# Patient Record
Sex: Male | Born: 1940 | Race: White | Hispanic: No | Marital: Married | Smoking: Never smoker
Health system: Southern US, Community
[De-identification: ages and names within clinical notes are randomized; demographics above are authoritative.]

## PROBLEM LIST (undated history)

## (undated) DIAGNOSIS — I442 Atrioventricular block, complete: Secondary | ICD-10-CM

## (undated) DIAGNOSIS — I10 Essential (primary) hypertension: Secondary | ICD-10-CM

## (undated) HISTORY — DX: Atrioventricular block, complete: I44.2

---

## 2000-03-19 ENCOUNTER — Other Ambulatory Visit: Admission: RE | Admit: 2000-03-19 | Discharge: 2000-03-19 | Payer: Self-pay | Admitting: Urology

## 2019-04-28 ENCOUNTER — Emergency Department (HOSPITAL_BASED_OUTPATIENT_CLINIC_OR_DEPARTMENT_OTHER)
Admission: EM | Admit: 2019-04-28 | Discharge: 2019-04-28 | Disposition: A | Discharge status: Home / Self Care | Payer: Medicare Other | Attending: Emergency Medicine | Admitting: Emergency Medicine

## 2019-04-28 ENCOUNTER — Emergency Department (HOSPITAL_BASED_OUTPATIENT_CLINIC_OR_DEPARTMENT_OTHER): Payer: Medicare Other

## 2019-04-28 ENCOUNTER — Other Ambulatory Visit: Payer: Self-pay

## 2019-04-28 DIAGNOSIS — Y9389 Activity, other specified: Secondary | ICD-10-CM | POA: Insufficient documentation

## 2019-04-28 DIAGNOSIS — S0003XA Contusion of scalp, initial encounter: Secondary | ICD-10-CM | POA: Diagnosis not present

## 2019-04-28 DIAGNOSIS — I129 Hypertensive chronic kidney disease with stage 1 through stage 4 chronic kidney disease, or unspecified chronic kidney disease: Secondary | ICD-10-CM | POA: Diagnosis not present

## 2019-04-28 DIAGNOSIS — W0110XA Fall on same level from slipping, tripping and stumbling with subsequent striking against unspecified object, initial encounter: Secondary | ICD-10-CM | POA: Insufficient documentation

## 2019-04-28 DIAGNOSIS — Y92003 Bedroom of unspecified non-institutional (private) residence as the place of occurrence of the external cause: Secondary | ICD-10-CM | POA: Insufficient documentation

## 2019-04-28 DIAGNOSIS — N183 Chronic kidney disease, stage 3 (moderate): Secondary | ICD-10-CM | POA: Diagnosis not present

## 2019-04-28 DIAGNOSIS — S0990XA Unspecified injury of head, initial encounter: Secondary | ICD-10-CM | POA: Diagnosis present

## 2019-04-28 DIAGNOSIS — Y999 Unspecified external cause status: Secondary | ICD-10-CM | POA: Insufficient documentation

## 2019-04-28 DIAGNOSIS — R55 Syncope and collapse: Secondary | ICD-10-CM | POA: Insufficient documentation

## 2019-04-28 DIAGNOSIS — Z79899 Other long term (current) drug therapy: Secondary | ICD-10-CM | POA: Diagnosis not present

## 2019-04-28 LAB — CBC WITH DIFFERENTIAL/PLATELET
Abs Immature Granulocytes: 0.03 10*3/uL (ref 0.00–0.07)
Basophils Absolute: 0.1 10*3/uL (ref 0.0–0.1)
Basophils Relative: 1 %
Eosinophils Absolute: 0 10*3/uL (ref 0.0–0.5)
Eosinophils Relative: 0 %
HCT: 48.1 % (ref 39.0–52.0)
Hemoglobin: 15.5 g/dL (ref 13.0–17.0)
Immature Granulocytes: 0 %
Lymphocytes Relative: 20 %
Lymphs Abs: 1.5 10*3/uL (ref 0.7–4.0)
MCH: 28.8 pg (ref 26.0–34.0)
MCHC: 32.2 g/dL (ref 30.0–36.0)
MCV: 89.2 fL (ref 80.0–100.0)
Monocytes Absolute: 0.7 10*3/uL (ref 0.1–1.0)
Monocytes Relative: 9 %
Neutro Abs: 5.4 10*3/uL (ref 1.7–7.7)
Neutrophils Relative %: 70 %
Platelets: 160 10*3/uL (ref 150–400)
RBC: 5.39 MIL/uL (ref 4.22–5.81)
RDW: 13.8 % (ref 11.5–15.5)
WBC: 7.7 10*3/uL (ref 4.0–10.5)
nRBC: 0 % (ref 0.0–0.2)

## 2019-04-28 LAB — COMPREHENSIVE METABOLIC PANEL
ALT: 16 U/L (ref 0–44)
AST: 19 U/L (ref 15–41)
Albumin: 3.9 g/dL (ref 3.5–5.0)
Alkaline Phosphatase: 70 U/L (ref 38–126)
Anion gap: 11 (ref 5–15)
BUN: 27 mg/dL — ABNORMAL HIGH (ref 8–23)
CO2: 23 mmol/L (ref 22–32)
Calcium: 8.9 mg/dL (ref 8.9–10.3)
Chloride: 105 mmol/L (ref 98–111)
Creatinine, Ser: 1.64 mg/dL — ABNORMAL HIGH (ref 0.61–1.24)
GFR calc Af Amer: 46 mL/min — ABNORMAL LOW (ref >60)
GFR calc non Af Amer: 40 mL/min — ABNORMAL LOW (ref >60)
Glucose, Bld: 135 mg/dL — ABNORMAL HIGH (ref 70–99)
Potassium: 3.8 mmol/L (ref 3.5–5.1)
Sodium: 139 mmol/L (ref 135–145)
Total Bilirubin: 1.1 mg/dL (ref 0.3–1.2)
Total Protein: 7.1 g/dL (ref 6.5–8.1)

## 2019-04-28 LAB — URINALYSIS, ROUTINE W REFLEX MICROSCOPIC
Bilirubin Urine: NEGATIVE
Glucose, UA: NEGATIVE mg/dL
Hgb urine dipstick: NEGATIVE
Ketones, ur: NEGATIVE mg/dL
Leukocytes,Ua: NEGATIVE
Nitrite: NEGATIVE
Protein, ur: NEGATIVE mg/dL
Specific Gravity, Urine: 1.015 (ref 1.005–1.030)
pH: 6 (ref 5.0–8.0)

## 2019-04-28 LAB — TROPONIN I (HIGH SENSITIVITY)
Troponin I (High Sensitivity): 10 ng/L (ref <18)
Troponin I (High Sensitivity): 11 ng/L (ref <18)

## 2019-04-28 LAB — CBG MONITORING, ED: Glucose-Capillary: 136 mg/dL — ABNORMAL HIGH (ref 70–99)

## 2019-04-28 MED ORDER — SODIUM CHLORIDE 0.9 % IV SOLN: INTRAVENOUS | Status: DC

## 2019-04-28 MED ORDER — SODIUM CHLORIDE 0.9 % IV BOLUS
1000.0000 mL | Freq: Once | INTRAVENOUS | Status: AC
  Administered 2019-04-28: 1000 mL via INTRAVENOUS

## 2019-04-28 NOTE — ED Provider Notes (Signed)
[facility] EMERGENCY DEPARTMENT Provider Note   CSN: [id number] Arrival date & time: 04/28/19  [id number]    History   Chief Complaint Chief Complaint  Patient presents with   Dizziness    HPI Larry Macdonald is a 78 y.o. male.     Pt presents to the ED today with a syncopal episode which happened around 0300 today.  Pt said he woke up early this morning and felt a little nauseous and dizzy.  He slowly got up to get a glass of water.  He got back into bed without problems, then tried to get up again.  He said things went "white" and he was on the ground.  He did hit his forehead.  He denies cp.  No f/c.  No known covid exposures.  His doctor did stop his amlodipine on his 03/22/19 visit.  He feels well now.     No past medical history on file.  There are no active problems to display for this patient.  Pmhx:  Htn, erectile dysfunction, ckd (stage 3)  Family History  Problem Relation Age of Onset   Lung cancer Father   Nephrolithiasis Father   Colon cancer Paternal Uncle   Nephrolithiasis Brother   Diabetes Neg Hx   Coronary artery disease Neg Hx   Stroke Neg Hx   Past Surgical History:  Procedure Laterality Date   CATARACT EXTRACTION, BILATERAL   VASECTOMY         Home Medications    Prior to Admission medications   Medication Sig Start Date End Date Taking? Authorizing Provider  lisinopril-hydrochlorothiazide (ZESTORETIC) 20-12.5 MG tablet Take 1 tablet by mouth daily.   Yes [provider]    Family History No family history on file.  Social History Social History   Tobacco Use   Smoking status: Not on file  Substance Use Topics   Alcohol use: Not on file   Drug use: Not on file     Allergies   Patient has no allergy information on record.   Review of Systems Review of Systems  Neurological: Positive for syncope and headaches.  All other systems reviewed and are negative.    Physical Exam Updated Vital  Signs BP (!) 162/84 (BP Location: Right Arm)    Pulse (!) 59    Temp 97.9 F (36.6 C) (Oral)    Resp 20    Ht 5\' 11"  (1.803 m)    Wt 83.9 kg    SpO2 100%    BMI 25.80 kg/m   Physical Exam Vitals signs and nursing note reviewed.  Constitutional:      Appearance: Normal appearance.  HENT:     Head: Normocephalic.      Right Ear: External ear normal.     Left Ear: External ear normal.     Nose: Nose normal.     Mouth/Throat:     Mouth: Mucous membranes are moist.     Pharynx: Oropharynx is clear.  Eyes:     Extraocular Movements: Extraocular movements intact.     Conjunctiva/sclera: Conjunctivae normal.     Pupils: Pupils are equal, round, and reactive to light.  Neck:     Musculoskeletal: Normal range of motion and neck supple.  Cardiovascular:     Rate and Rhythm: Normal rate and regular rhythm.     Pulses: Normal pulses.     Heart sounds: Normal heart sounds.  Pulmonary:     Effort: Pulmonary effort is normal.     Breath  sounds: Normal breath sounds.  Abdominal:     General: Abdomen is flat. Bowel sounds are normal.     Palpations: Abdomen is soft.  Musculoskeletal: Normal range of motion.  Skin:    General: Skin is warm.     Capillary Refill: Capillary refill takes less than 2 seconds.  Neurological:     General: No focal deficit present.     Mental Status: He is alert and oriented to person, place, and time.  Psychiatric:        Mood and Affect: Mood normal.        Behavior: Behavior normal.        Thought Content: Thought content normal.        Judgment: Judgment normal.      ED Treatments / Results  Labs (all labs ordered are listed, but only abnormal results are displayed) Labs Reviewed  COMPREHENSIVE METABOLIC PANEL - Abnormal; Notable for the following components:      Result Value   Glucose, Bld 135 (*)    BUN 27 (*)    Creatinine, Ser 1.64 (*)    GFR calc non Af Amer 40 (*)    GFR calc Af Amer 46 (*)    All other components within normal limits    CBG MONITORING, ED - Abnormal; Notable for the following components:   Glucose-Capillary 136 (*)    All other components within normal limits  CBC WITH DIFFERENTIAL/PLATELET  URINALYSIS, ROUTINE W REFLEX MICROSCOPIC  TROPONIN I (HIGH SENSITIVITY)  TROPONIN I (HIGH SENSITIVITY)    EKG EKG Interpretation  Date/Time:  Thursday April 28 2019 09:17:39 EDT Ventricular Rate:  65 PR Interval:    QRS Duration: 178 QT Interval:  459 QTC Calculation: 478 R Axis:   -73 Text Interpretation:  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 EXAM: CHEST - 2 VIEW COMPARISON:  None. FINDINGS: Normal sized heart. Tortuous aorta. Clear lungs. Thoracic spine degenerative changes. IMPRESSION: No acute abnormality. Electronically Signed   By: Claudie Revering M.D.   On: 04/28/2019 09:50   Ct Head Wo Contrast  Result Date: 04/28/2019 CLINICAL DATA:  Syncopal episode resulting in a fall, hitting his head last night. EXAM: CT HEAD WITHOUT CONTRAST TECHNIQUE: Contiguous axial images were obtained from the base of the skull through the vertex without intravenous contrast. COMPARISON:  None. FINDINGS: Brain: Mildly enlarged ventricles and cortical sulci. Mild patchy white matter low density in both cerebral hemispheres. No intracranial hemorrhage, mass lesion or CT evidence of acute infarction. Vascular: No hyperdense vessel or unexpected calcification. Skull: Normal. Negative for fracture or focal lesion. Sinuses/Orbits: Status post bilateral cataract extraction. Small amount of retained secretions in the sphenoid sinus on the right. Other: Bilateral concha bullosa, greater on the left, with mild deviation of the adjacent mid nasal septum to the right. IMPRESSION: 1. No acute abnormality. 2. Mild diffuse  cerebral and cerebellar atrophy. 3. Mild chronic small vessel white matter ischemic changes in both cerebral hemispheres. Electronically Signed   By: Claudie Revering M.D.   On: 04/28/2019 09:54    Procedures Procedures (including critical care time)  Medications Ordered in ED Medications  sodium chloride 0.9 % bolus 1,000 mL (0 mLs Intravenous Stopped 04/28/19 1133)    And  0.9 %  sodium chloride infusion (has  no administration in time range)     Initial Impression / Assessment and Plan / ED Course  I have reviewed the triage vital signs and the nursing notes.  Pertinent labs & imaging results that were available during my care of the patient were reviewed by me and considered in my medical decision making (see chart for details).    Pt is feeling well now.  I think the syncope is related to his bp.  Pt has been keeping track of his bp and they have been low since he was taken off his amlodipine.  He said he's been checking them at random times during the day.  I asked him to check then in the morning and at night.  Then let his doctor know.  Here, the SBPs are in the 150s to 160s.  Pt had 2 negative troponins and other work up is nl.  Pt is stable for d/c.  Return if worse.   Final Clinical Impressions(s) / ED Diagnoses   Final diagnoses:  Syncope, unspecified syncope type  Contusion of scalp, initial encounter    ED Discharge Orders    None       Jacalyn LefevreHaviland, Maliki Gignac, MD 04/28/19 1330

## 2019-04-28 NOTE — ED Triage Notes (Signed)
Pt states recently changed dose of amlodipine, dizzy last night after getting up from bed, hit head, small bruise left forehead.  Pt states feels fine today.  PMD sent to ER

## 2019-11-04 ENCOUNTER — Ambulatory Visit: Payer: Medicare Other | Attending: Internal Medicine

## 2019-11-04 DIAGNOSIS — Z23 Encounter for immunization: Secondary | ICD-10-CM | POA: Insufficient documentation

## 2019-11-04 NOTE — Progress Notes (Signed)
   Covid-19 Vaccination Clinic  Name:  Larry Macdonald    MRN: [id number] DOB: 01-20-1941  11/04/2019  [name] was observed post Covid-19 immunization for 15 minutes without incidence. He was provided with Vaccine Information Sheet and instruction to access the V-Safe system.   [name] was instructed to call 911 with any severe reactions post vaccine: Marland Kitchen Difficulty breathing  . Swelling of your face and throat  . A fast heartbeat  . A bad rash all over your body  . Dizziness and weakness    Immunizations Administered    Name Date Dose VIS Date Route   Pfizer COVID-19 Vaccine 11/04/2019  8:52 AM 0.3 mL 09/30/2019 Intramuscular   Manufacturer: [employer], [employer]   Lot: [id number]   NDC: [address]-8

## 2019-11-25 ENCOUNTER — Ambulatory Visit: Payer: Medicare Other | Attending: Internal Medicine

## 2019-11-25 DIAGNOSIS — Z23 Encounter for immunization: Secondary | ICD-10-CM | POA: Insufficient documentation

## 2019-11-25 NOTE — Progress Notes (Signed)
   Covid-19 Vaccination Clinic  Name:  Larry Macdonald    MRN: [id number] DOB: 07-May-1941  11/25/2019  Larry Macdonald was observed post Covid-19 immunization for 15 minutes without incidence. He was provided with Vaccine Information Sheet and instruction to access the V-Safe system.   Larry Macdonald was instructed to call 911 with any severe reactions post vaccine: Marland Kitchen Difficulty breathing  . Swelling of your face and throat  . A fast heartbeat  . A bad rash all over your body  . Dizziness and weakness    Immunizations Administered    Name Date Dose VIS Date Route   Pfizer COVID-19 Vaccine 11/25/2019  9:01 AM 0.3 mL 09/30/2019 Intramuscular   Manufacturer: [employer], [employer]   Lot: [id number]   NDC: [address]-9

## 2019-11-29 IMAGING — CT CT HEAD WITHOUT CONTRAST
3 series · 15 of 47 positions shown, 18 images · non-contrast
Comparison: None.

CLINICAL DATA: Syncopal episode resulting in a fall, hitting his
head last night.

EXAM:
CT HEAD WITHOUT CONTRAST
TECHNIQUE: Contiguous axial images were obtained from the base of the skull
through the vertex without intravenous contrast.

[Series 2: head wo · axial · 0.45mm/px · z∈[-179,-39]mm · 9 of 34 slices shown, 12 images]
[im 3/34  brain]
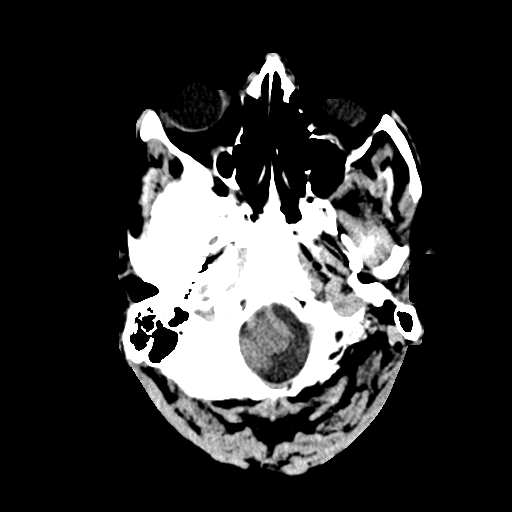
[im 3/34  bone]
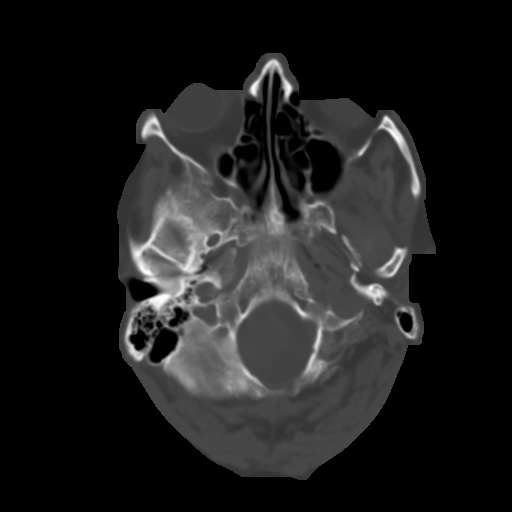
[im 6/34  brain]
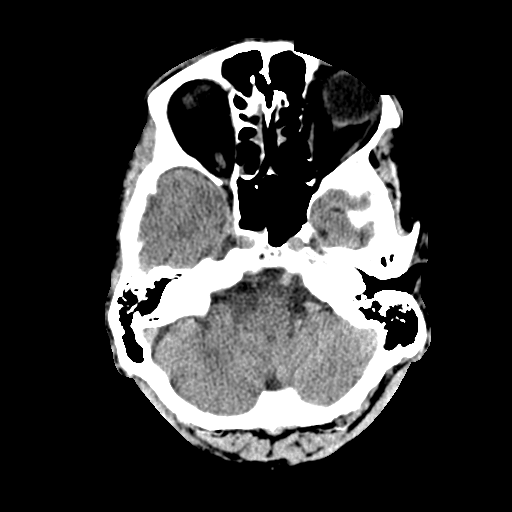
[im 10/34  brain]
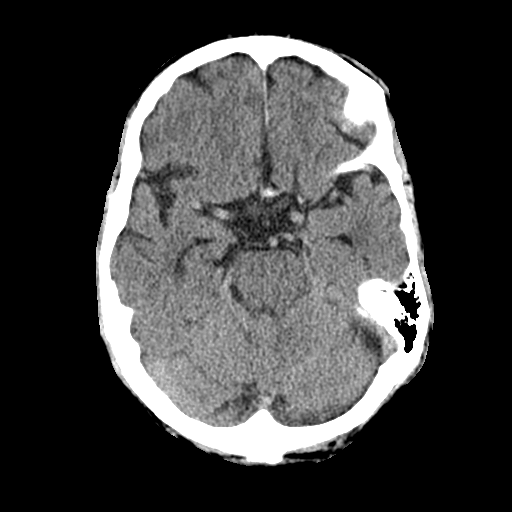
[im 13/34  brain]
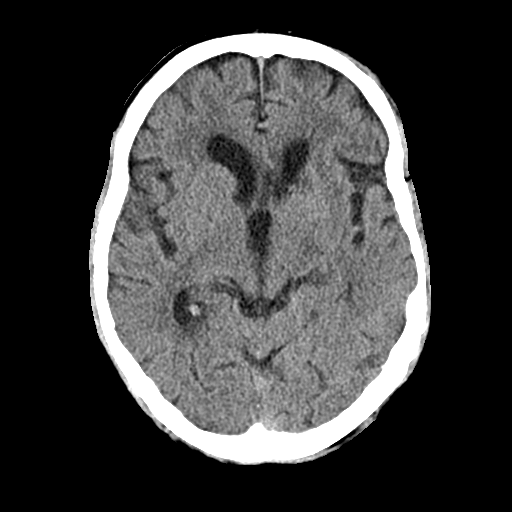
[im 18/34  brain]
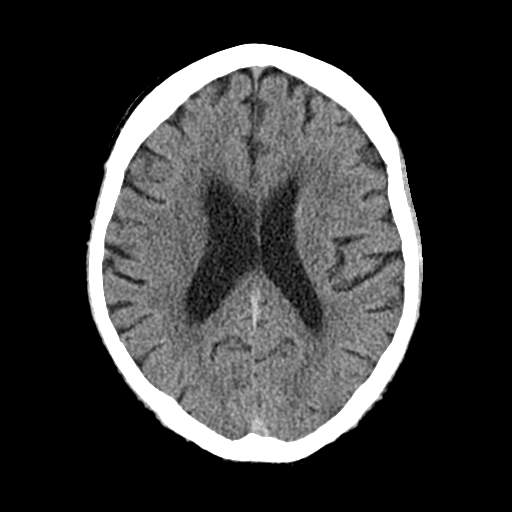
[im 18/34  bone]
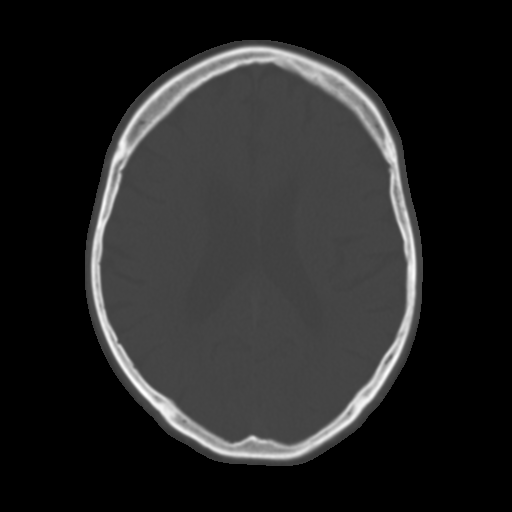
[im 21/34  brain]
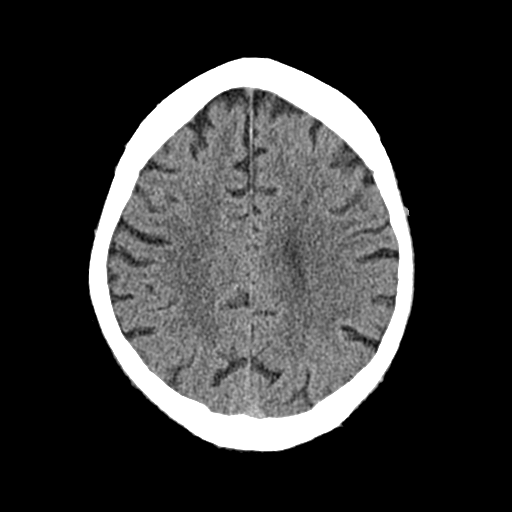
[im 24/34  brain]
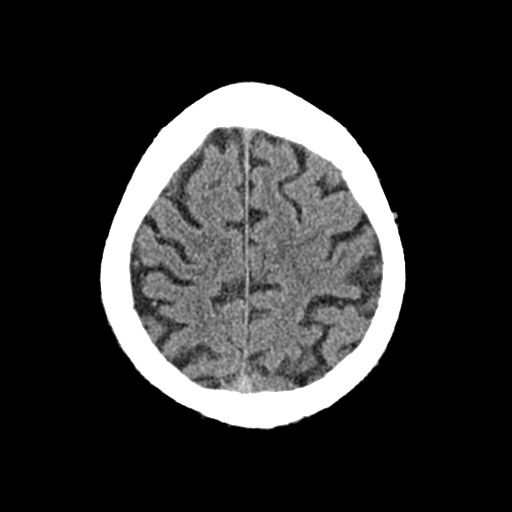
[im 28/34  brain]
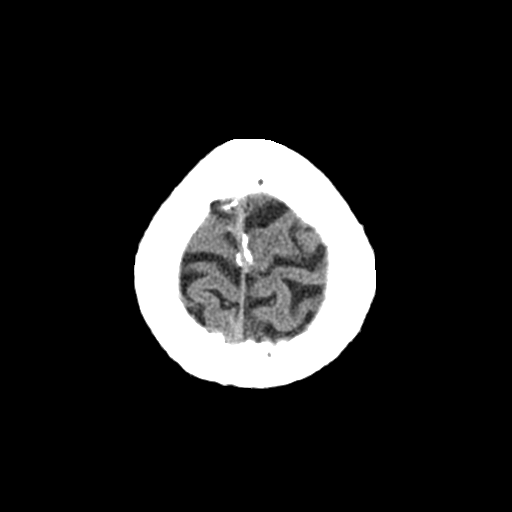
[im 31/34  brain]
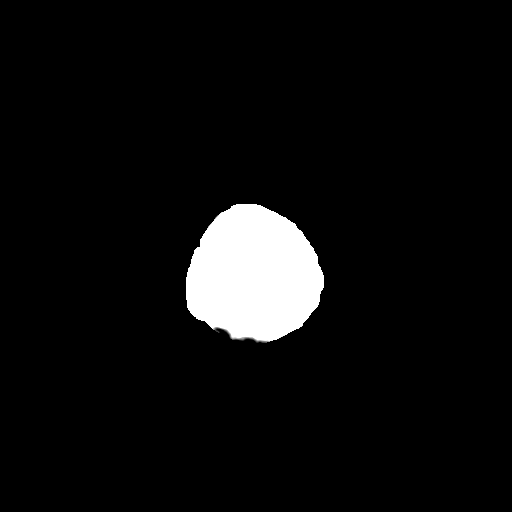
[im 31/34  bone]
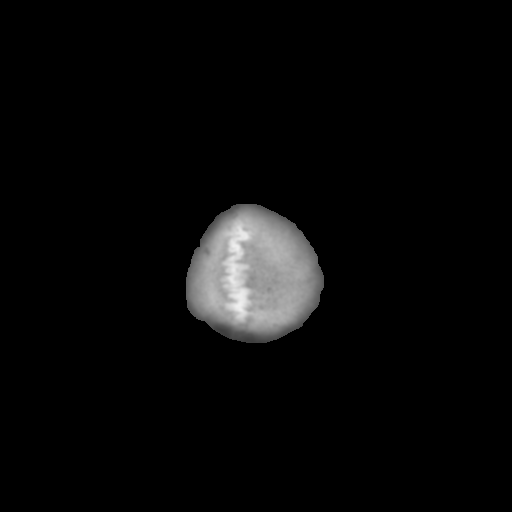

[Series 4: cor soft · coronal · 0.35mm/px · 3 of 71 slices shown]
[im 24/71  brain]
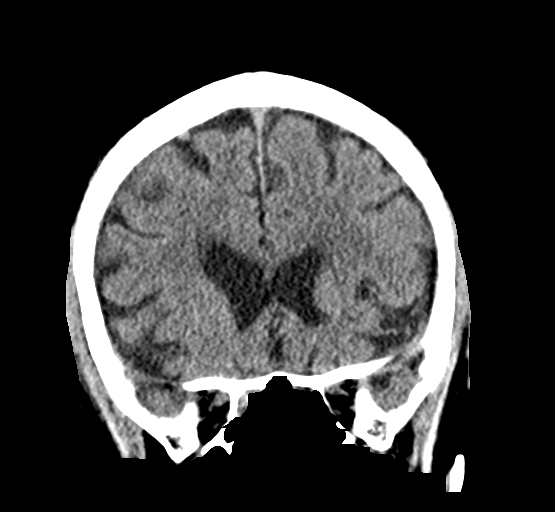
[im 32/71  brain]
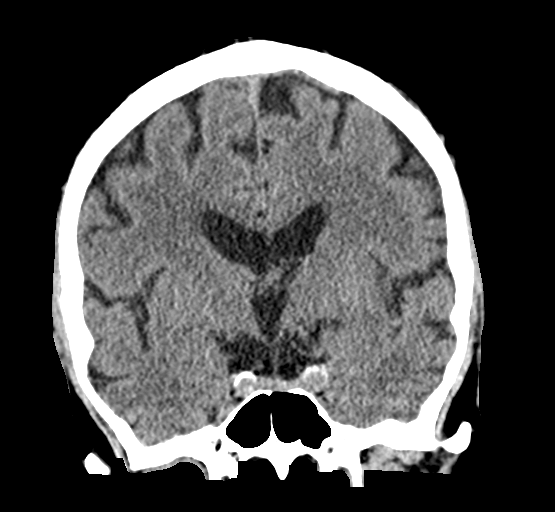
[im 39/71  brain]
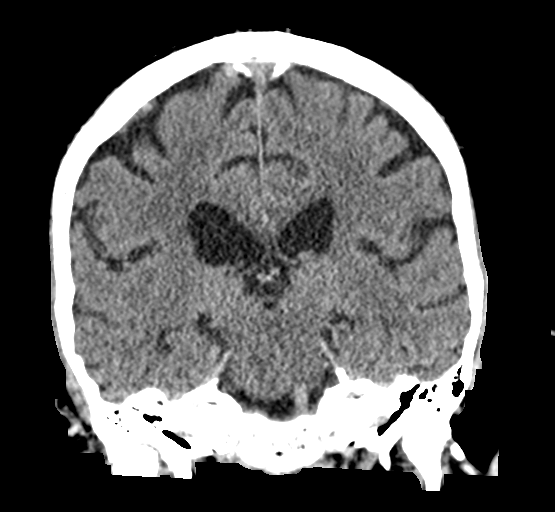

[Series 5: sag soft · sagittal · 0.36mm/px · 3 of 59 slices shown]
[im 20/59  brain]
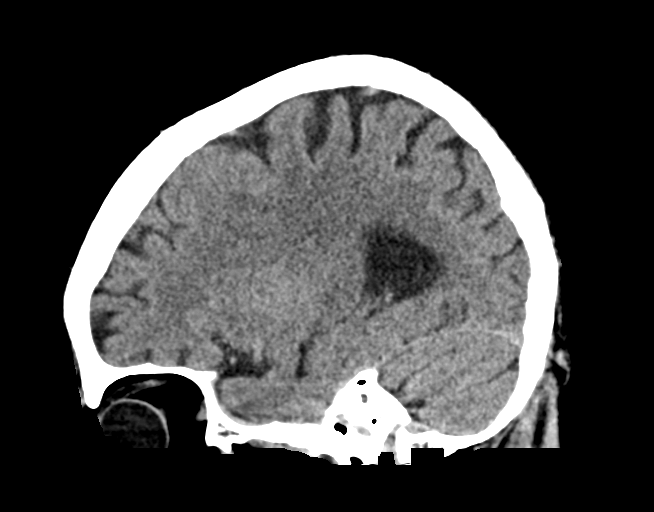
[im 30/59  brain]
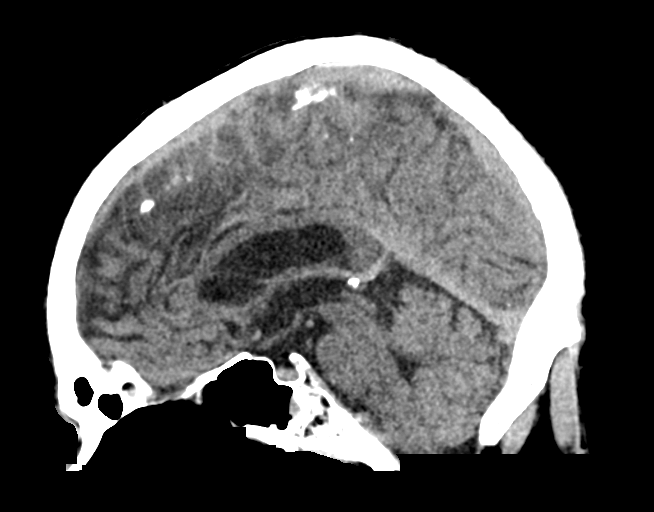
[im 39/59  brain]
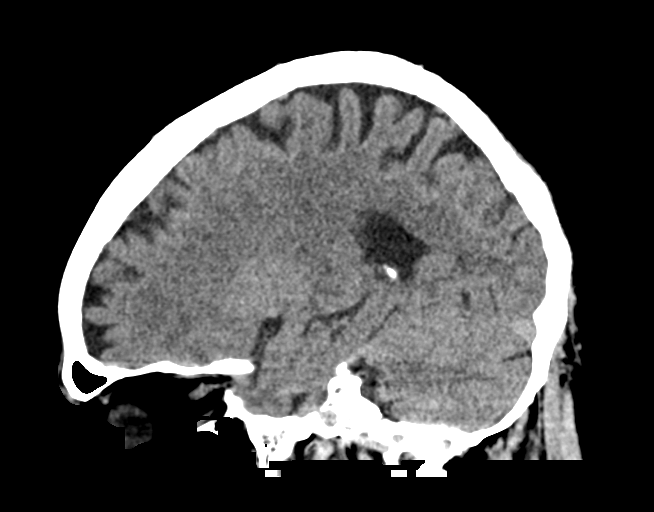

[15 of 47 positions shown; findings below may reference images not displayed]

FINDINGS: Brain: Mildly enlarged ventricles and cortical sulci. Mild patchy
white matter low density in both cerebral hemispheres. No
intracranial hemorrhage, mass lesion or CT evidence of acute
infarction.

Vascular: No hyperdense vessel or unexpected calcification.

Skull: Normal. Negative for fracture or focal lesion.

Sinuses/Orbits: Status post bilateral cataract extraction. Small
amount of retained secretions in the sphenoid sinus on the right.

Other: Bilateral concha bullosa, greater on the left, with mild
deviation of the adjacent mid nasal septum to the right.
IMPRESSION: 1. No acute abnormality.
2. Mild diffuse cerebral and cerebellar atrophy.
3. Mild chronic small vessel white matter ischemic changes in both
cerebral hemispheres.

## 2021-04-01 ENCOUNTER — Emergency Department (HOSPITAL_BASED_OUTPATIENT_CLINIC_OR_DEPARTMENT_OTHER): Payer: Medicare Other

## 2021-04-01 ENCOUNTER — Encounter (HOSPITAL_BASED_OUTPATIENT_CLINIC_OR_DEPARTMENT_OTHER): Payer: Self-pay | Admitting: Urology

## 2021-04-01 ENCOUNTER — Inpatient Hospital Stay (HOSPITAL_BASED_OUTPATIENT_CLINIC_OR_DEPARTMENT_OTHER)
Admission: EM | Admit: 2021-04-01 | Discharge: 2021-04-03 | DRG: 244 | Disposition: A | Discharge status: Home / Self Care | Payer: Medicare Other | Attending: Internal Medicine | Admitting: Internal Medicine

## 2021-04-01 ENCOUNTER — Other Ambulatory Visit: Payer: Self-pay

## 2021-04-01 DIAGNOSIS — I10 Essential (primary) hypertension: Secondary | ICD-10-CM

## 2021-04-01 DIAGNOSIS — R42 Dizziness and giddiness: Secondary | ICD-10-CM | POA: Diagnosis not present

## 2021-04-01 DIAGNOSIS — F17210 Nicotine dependence, cigarettes, uncomplicated: Secondary | ICD-10-CM | POA: Diagnosis present

## 2021-04-01 DIAGNOSIS — R001 Bradycardia, unspecified: Secondary | ICD-10-CM

## 2021-04-01 DIAGNOSIS — M109 Gout, unspecified: Secondary | ICD-10-CM | POA: Diagnosis present

## 2021-04-01 DIAGNOSIS — I129 Hypertensive chronic kidney disease with stage 1 through stage 4 chronic kidney disease, or unspecified chronic kidney disease: Secondary | ICD-10-CM | POA: Diagnosis present

## 2021-04-01 DIAGNOSIS — Z20822 Contact with and (suspected) exposure to covid-19: Secondary | ICD-10-CM | POA: Diagnosis present

## 2021-04-01 DIAGNOSIS — Z8616 Personal history of COVID-19: Secondary | ICD-10-CM

## 2021-04-01 DIAGNOSIS — I442 Atrioventricular block, complete: Principal | ICD-10-CM | POA: Diagnosis present

## 2021-04-01 DIAGNOSIS — Z959 Presence of cardiac and vascular implant and graft, unspecified: Secondary | ICD-10-CM

## 2021-04-01 DIAGNOSIS — N183 Chronic kidney disease, stage 3 unspecified: Secondary | ICD-10-CM | POA: Diagnosis present

## 2021-04-01 DIAGNOSIS — Z79899 Other long term (current) drug therapy: Secondary | ICD-10-CM

## 2021-04-01 HISTORY — DX: Essential (primary) hypertension: I10

## 2021-04-01 LAB — COMPREHENSIVE METABOLIC PANEL
ALT: 33 U/L (ref 0–44)
AST: 30 U/L (ref 15–41)
Albumin: 3.8 g/dL (ref 3.5–5.0)
Alkaline Phosphatase: 75 U/L (ref 38–126)
Anion gap: 8 (ref 5–15)
BUN: 20 mg/dL (ref 8–23)
CO2: 22 mmol/L (ref 22–32)
Calcium: 9 mg/dL (ref 8.9–10.3)
Chloride: 109 mmol/L (ref 98–111)
Creatinine, Ser: 1.57 mg/dL — ABNORMAL HIGH (ref 0.61–1.24)
GFR, Estimated: 45 mL/min — ABNORMAL LOW (ref >60)
Glucose, Bld: 157 mg/dL — ABNORMAL HIGH (ref 70–99)
Potassium: 3.9 mmol/L (ref 3.5–5.1)
Sodium: 139 mmol/L (ref 135–145)
Total Bilirubin: 1.2 mg/dL (ref 0.3–1.2)
Total Protein: 7.1 g/dL (ref 6.5–8.1)

## 2021-04-01 LAB — CBC WITH DIFFERENTIAL/PLATELET
Abs Immature Granulocytes: 0.03 10*3/uL (ref 0.00–0.07)
Basophils Absolute: 0.1 10*3/uL (ref 0.0–0.1)
Basophils Relative: 1 %
Eosinophils Absolute: 0.3 10*3/uL (ref 0.0–0.5)
Eosinophils Relative: 4 %
HCT: 48.8 % (ref 39.0–52.0)
Hemoglobin: 16 g/dL (ref 13.0–17.0)
Immature Granulocytes: 0 %
Lymphocytes Relative: 17 %
Lymphs Abs: 1.5 10*3/uL (ref 0.7–4.0)
MCH: 30 pg (ref 26.0–34.0)
MCHC: 32.8 g/dL (ref 30.0–36.0)
MCV: 91.6 fL (ref 80.0–100.0)
Monocytes Absolute: 0.6 10*3/uL (ref 0.1–1.0)
Monocytes Relative: 7 %
Neutro Abs: 6.7 10*3/uL (ref 1.7–7.7)
Neutrophils Relative %: 71 %
Platelets: 159 10*3/uL (ref 150–400)
RBC: 5.33 MIL/uL (ref 4.22–5.81)
RDW: 14.6 % (ref 11.5–15.5)
WBC: 9.3 10*3/uL (ref 4.0–10.5)
nRBC: 0 % (ref 0.0–0.2)

## 2021-04-01 LAB — CREATININE, SERUM
Creatinine, Ser: 1.65 mg/dL — ABNORMAL HIGH (ref 0.61–1.24)
GFR, Estimated: 42 mL/min — ABNORMAL LOW (ref >60)

## 2021-04-01 LAB — CBC
HCT: 55.3 % — ABNORMAL HIGH (ref 39.0–52.0)
Hemoglobin: 17.5 g/dL — ABNORMAL HIGH (ref 13.0–17.0)
MCH: 29.9 pg (ref 26.0–34.0)
MCHC: 31.6 g/dL (ref 30.0–36.0)
MCV: 94.4 fL (ref 80.0–100.0)
Platelets: 212 10*3/uL (ref 150–400)
RBC: 5.86 MIL/uL — ABNORMAL HIGH (ref 4.22–5.81)
RDW: 14.6 % (ref 11.5–15.5)
WBC: 14.5 10*3/uL — ABNORMAL HIGH (ref 4.0–10.5)
nRBC: 0 % (ref 0.0–0.2)

## 2021-04-01 LAB — RESP PANEL BY RT-PCR (FLU A&B, COVID) ARPGX2
Influenza A by PCR: NEGATIVE
Influenza B by PCR: NEGATIVE
SARS Coronavirus 2 by RT PCR: NEGATIVE

## 2021-04-01 LAB — MAGNESIUM: Magnesium: 1.6 mg/dL — ABNORMAL LOW (ref 1.7–2.4)

## 2021-04-01 LAB — MRSA NEXT GEN BY PCR, NASAL: MRSA by PCR Next Gen: NOT DETECTED

## 2021-04-01 LAB — TSH: TSH: 3.983 u[IU]/mL (ref 0.350–4.500)

## 2021-04-01 MED ORDER — HYDRALAZINE HCL 10 MG PO TABS
10.0000 mg | ORAL_TABLET | Freq: Four times a day (QID) | ORAL | Status: DC
  Administered 2021-04-01 – 2021-04-03 (×7): 10 mg via ORAL
  Filled 2021-04-01 (×8): qty 1

## 2021-04-01 MED ORDER — MAGNESIUM SULFATE 2 GM/50ML IV SOLN
2.0000 g | Freq: Once | INTRAVENOUS | Status: AC
  Administered 2021-04-01: 2 g via INTRAVENOUS
  Filled 2021-04-01: qty 50

## 2021-04-01 MED ORDER — NITROGLYCERIN 0.4 MG SL SUBL: 0.4000 mg | SUBLINGUAL_TABLET | SUBLINGUAL | Status: DC | PRN

## 2021-04-01 MED ORDER — ALLOPURINOL 100 MG PO TABS
100.0000 mg | ORAL_TABLET | Freq: Every day | ORAL | Status: DC
  Administered 2021-04-02 – 2021-04-03 (×2): 100 mg via ORAL
  Filled 2021-04-01 (×2): qty 1

## 2021-04-01 MED ORDER — HEPARIN SODIUM (PORCINE) 5000 UNIT/ML IJ SOLN
5000.0000 [IU] | Freq: Three times a day (TID) | INTRAMUSCULAR | Status: DC
  Administered 2021-04-01 – 2021-04-02 (×2): 5000 [IU] via SUBCUTANEOUS
  Filled 2021-04-01 (×2): qty 1

## 2021-04-01 MED ORDER — ASPIRIN EC 81 MG PO TBEC
81.0000 mg | DELAYED_RELEASE_TABLET | Freq: Every day | ORAL | Status: DC
  Administered 2021-04-02: 81 mg via ORAL
  Filled 2021-04-01: qty 1

## 2021-04-01 MED ORDER — ONDANSETRON HCL 4 MG/2ML IJ SOLN: 4.0000 mg | Freq: Four times a day (QID) | INTRAMUSCULAR | Status: DC | PRN

## 2021-04-01 MED ORDER — MAGNESIUM SULFATE 50 % IJ SOLN: 2.0000 g | Freq: Once | INTRAMUSCULAR | Status: DC

## 2021-04-01 MED ORDER — ACETAMINOPHEN 325 MG PO TABS: 650.0000 mg | ORAL_TABLET | ORAL | Status: DC | PRN

## 2021-04-01 NOTE — Plan of Care (Signed)
Initiate Care Plan  Problem: Education: Goal: Knowledge of General Education information will improve Description: Including pain rating scale, medication(s)/side effects and non-pharmacologic comfort measures Outcome: Progressing   Problem: Health Behavior/Discharge Planning: Goal: Ability to manage health-related needs will improve Outcome: Progressing   Problem: Clinical Measurements: Goal: Ability to maintain clinical measurements within normal limits will improve Outcome: Progressing Goal: Will remain free from infection Outcome: Progressing Goal: Diagnostic test results will improve Outcome: Progressing Goal: Respiratory complications will improve Outcome: Progressing Goal: Cardiovascular complication will be avoided Outcome: Progressing   Problem: Activity: Goal: Risk for activity intolerance will decrease Outcome: Progressing   Problem: Nutrition: Goal: Adequate nutrition will be maintained Outcome: Progressing   Problem: Coping: Goal: Level of anxiety will decrease Outcome: Progressing   Problem: Elimination: Goal: Will not experience complications related to bowel motility Outcome: Progressing Goal: Will not experience complications related to urinary retention Outcome: Progressing   Problem: Pain Managment: Goal: General experience of comfort will improve Outcome: Progressing   Problem: Safety: Goal: Ability to remain free from injury will improve Outcome: Progressing   Problem: Skin Integrity: Goal: Risk for impaired skin integrity will decrease Outcome: Progressing   Problem: Education: Goal: Ability to demonstrate management of disease process will improve Outcome: Progressing Goal: Ability to verbalize understanding of medication therapies will improve Outcome: Progressing Goal: Individualized Educational Video(s) Outcome: Progressing   Problem: Activity: Goal: Capacity to carry out activities will improve Outcome: Progressing   Problem:  Cardiac: Goal: Ability to achieve and maintain adequate cardiopulmonary perfusion will improve Outcome: Progressing   

## 2021-04-01 NOTE — ED Notes (Signed)
To on monitor and vitals cycling

## 2021-04-01 NOTE — ED Notes (Signed)
Report given to [facility] with [employer]

## 2021-04-01 NOTE — ED Notes (Signed)
ED Provider at bedside. 

## 2021-04-01 NOTE — ED Notes (Signed)
Report given to Rosalita Chessman RN receiving nurse at [facility]

## 2021-04-01 NOTE — ED Notes (Signed)
Pt aware of need to remain NPO, states last po food/fluid was breakfast @0800 .  Aware of plan for admission

## 2021-04-01 NOTE — H&P (Signed)
Cardiology Admission History and Physical:   Patient ID: Larry Macdonald MRN: [id number]; DOB: Aug 20, 1941   Admission date: 04/01/2021  PCP:  Burnis Medin, PA-C   [facility] Providers Cardiologist:  None   {  Chief Complaint:  low heart rate with some associated lightheadedness  Patient Profile:   Larry Macdonald is a 80 y.o. male with a history of hypertension, CKD stage III, gout, and remote tobacco use who presents with low heart rate for 1 week with associated lightheadedness and was found to have complete heart block.   History of Present Illness:   Larry Macdonald is a 80 year old male with a history of hypertension, CKD stage III followed by Nephrology, and gout. No known cardiac history. He states he had a stress test over 30 years ago for further work up of hypertension but this was normal. No other prior cardiac testing. He does not see a Development worker, international aid. He has a remote smoking history but quit 45 years ago. No family history of heart disease.  Patient presented to the [facility] ED today for further evaluation of low heart rate. Patient states he checks his BP a couple of times a week at home and has been noticing this past week that his heart rates has been low in the 40s. He also reports that for the last 3-4 day he has noticed some lightheadedness. This is worse with quick position changes but he has had this at rest as well. He denies any recent syncope. He did have 1 syncopal episode in 04/2019 that occurred when he got out of bed quickly. He was seen in the ED at that time. Work-up was unremarkable and syncope was felt to be due to low BP. Echo was not done at that time. No other syncopal episodes. He denies any chest pain. He reports some change in his breathing the last few days but has difficulty describing it. He states it just feels like he is breathing "more" or faster sometimes. No significant shortness of breath though. No orthopnea, PND, or edema. No  recent fevers or illnesses. He has loose stools at baseline but no other GI symptoms. No abnormal bleeding in urine or stools.   In the ED, patient hypertensive with BP as high as 205/97. EKG showed complete heart block, rate 39, with ST depression/ T wave inversion in inferior leads and V4-V6. Chest x-ray showed no acute findings. WBC 9.3, Hgb 16.0, Plts 159. Na 139, K 3.9, Glucose 157, BUN 20, Cr 1.57. LFTs normal. Magnesium 1.6. Respiratory panel negative for COVID and influenza A/B.  Past Medical History:  Diagnosis Date   Hypertension     History reviewed. No pertinent surgical history.   Medications Prior to Admission: Prior to Admission medications   Medication Sig Start Date End Date Taking? Authorizing Provider  allopurinol (ZYLOPRIM) 100 MG tablet Take 100 mg by mouth daily.   Yes [provider]  lisinopril-hydrochlorothiazide (ZESTORETIC) 20-12.5 MG tablet Take 1 tablet by mouth daily.    [provider]     Allergies:   Not on File  Social History:   Social History   Socioeconomic History   Marital status: Married    Spouse name: Not on file   Number of children: Not on file   Years of education: Not on file   Highest education level: Not on file  Occupational History   Not on file  Tobacco Use   Smoking status: Never   Smokeless tobacco:  Not on file  Vaping Use   Vaping Use: Never used  Substance and Sexual Activity   Alcohol use: Yes    Comment: socially   Drug use: Never   Sexual activity: Not on file  Other Topics Concern   Not on file  Social History Narrative   Not on file   Social Determinants of Health   Financial Resource Strain: Not on file  Food Insecurity: Not on file  Transportation Needs: Not on file  Physical Activity: Not on file  Stress: Not on file  Social Connections: Not on file  Intimate Partner Violence: Not on file    Family History:   The patient's family history includes Lung cancer in his father. There  is no history of Heart disease.    ROS:  Please see the history of present illness.  Review of Systems  Constitutional:  Negative for chills and fever.  HENT:  Negative for congestion.   Respiratory:  Negative for cough and shortness of breath.   Cardiovascular:  Negative for chest pain, palpitations, orthopnea, leg swelling and PND.  Gastrointestinal:  Negative for blood in stool, melena, nausea and vomiting. Diarrhea: loose stools. Genitourinary:  Negative for hematuria.  Musculoskeletal:  Negative for myalgias.  Neurological:  Positive for dizziness (lightheadedness). Negative for loss of consciousness.  Endo/Heme/Allergies:  Does not bruise/bleed easily.  Psychiatric/Behavioral:  Negative for substance abuse (remote tobacco use).    Physical Exam/Data:   Vitals:   04/01/21 1612 04/01/21 1754 04/01/21 1755 04/01/21 1756  BP:   (!) 188/89   Pulse:    (!) 34  Resp:   18 17  Temp: 97.8 F (36.6 C) (!) 97.5 F (36.4 C)    TempSrc: Oral Oral    SpO2:   94%   Weight:      Height:        Intake/Output Summary (Last 24 hours) at 04/01/2021 1836 Last data filed at 04/01/2021 1433 Gross per 24 hour  Intake 50 ml  Output 600 ml  Net -550 ml   Last 3 Weights 04/01/2021 04/28/2019  Weight (lbs) 185 lb 185 lb  Weight (kg) 83.915 kg 83.915 kg     Body mass index is 25.8 kg/m.  General: 80 y.o. male resting comfortably in no acute distress. HEENT: Normocephalic and atraumatic. Sclera clear.  Neck: Supple. No carotid bruits. No JVD.  Heart: Bradycardic with regular rhythm. Distinct S1 and S2. No murmurs, gallops, or rubs. Radial and distal pedal pulses 2+ and equal bilaterally. Lungs: No increased work of breathing. Clear to ausculation bilaterally. No wheezes, rhonchi, or rales.  Abdomen: Soft, non-distended, and non-tender to palpation. Bowel sounds present. MSK: Normal strength and tone for age. Extremities: No lower extremity edema.    Skin: Warm and dry. Neuro: Alert and  oriented x3. No focal deficits. Psych: Normal affect. Responds appropriately.  EKG:  The ECG that was done was personally reviewed and demonstrates complete heart block, rate 39, with ST depression/ T wave inversion in inferior leads and V4-V6.  Relevant CV Studies: None.  Laboratory Data:  High Sensitivity Troponin:  No results for input(s): TROPONINIHS in the last 720 hours.    Chemistry Recent Labs  Lab 04/01/21 0936  NA 139  K 3.9  CL 109  CO2 22  GLUCOSE 157*  BUN 20  CREATININE 1.57*  CALCIUM 9.0  GFRNONAA 45*  ANIONGAP 8    Recent Labs  Lab 04/01/21 0936  PROT 7.1  ALBUMIN 3.8  AST 30  ALT 33  ALKPHOS 75  BILITOT 1.2   Hematology Recent Labs  Lab 04/01/21 0936  WBC 9.3  RBC 5.33  HGB 16.0  HCT 48.8  MCV 91.6  MCH 30.0  MCHC 32.8  RDW 14.6  PLT 159   BNPNo results for input(s): BNP, PROBNP in the last 168 hours.  DDimer No results for input(s): DDIMER in the last 168 hours.   Radiology/Studies:  DG Chest Portable 1 View  Result Date: 04/01/2021 CLINICAL DATA:  Complete heart block EXAM: PORTABLE CHEST 1 VIEW COMPARISON:  04/28/2019 FINDINGS: Artifact from defibrillator pads and EKG leads. Stable heart size and mediastinal contours. Interstitial prominence but no Kerley lines, effusion, or air bronchogram. IMPRESSION: Stable from 2020.  No pulmonary edema. Electronically Signed   By: Marnee Spring M.D.   On: 04/01/2021 10:02     Assessment and Plan:   Complete Heart Patient - Patient noticed low heart rate in the 40s over the last week with some associated lightheadedness. Found to be in complete heart block with heart rates as low as the 30s.  - Asymptomatic at this time. - BP stable - actually hypertensive.  - Not on any AV nodal agents.  - Magnesium low at 1.6 in the ED. Supplemented. Will recheck lab in the morning. - Potassium normal. - Will check TSH. - Will check Echo. - Will make NPO at midnight for likely pacemaker in the morning.  EP to see tomorrow.  Hypertension - Systolic BP as high as 205/97. BP usually well controlled at home. - On Lisinopril 10mg  daily at home but has not taken this today (usually takes at night). - Will allow for some hypertension at this time given complete heart block. Will hold home Lisinopril for now and start PO Hydralazine 10mg  every 6 hours with instructions to hold if systolic BP <130.   CKD Stage III - Creatinine 1.57 on admission. Baseline around 1.4 to 1.5.  - Continue to monitor.  Hypomagnesemia - Magnesium 1.6 on admission. Supplemented. - Will recheck in the morning.  Gout - Continue home Allopurinol.    Risk Assessment/Risk Scores:   N/A  Severity of Illness: The appropriate patient status for this patient is INPATIENT. Inpatient status is judged to be reasonable and necessary in order to provide the required intensity of service to ensure the patient's safety. The patient's presenting symptoms, physical exam findings, and initial radiographic and laboratory data in the context of their chronic comorbidities is felt to place them at high risk for further clinical deterioration. Furthermore, it is not anticipated that the patient will be medically stable for discharge from the hospital within 2 midnights of admission. The following factors support the patient status of inpatient.   " The patient's presenting symptoms include low heart rate and lightheadedness. " The worrisome physical exam findings include complete heart block. " The initial radiographic and laboratory data are worrisome because of as above. " The chronic co-morbidities include HTN and CKD.   * I certify that at the point of admission it is my clinical judgment that the patient will require inpatient hospital care spanning beyond 2 midnights from the point of admission due to high intensity of service, high risk for further deterioration and high frequency of surveillance required.*   For questions or  updates, please contact [facility] Please consult [email or web address] for contact info under     Signed, , PA-C  04/01/2021 6:36 PM

## 2021-04-01 NOTE — ED Notes (Signed)
Denies any pain, slight SOB with exertion, h/o covid postitive at home 4 weeks ago.

## 2021-04-01 NOTE — ED Provider Notes (Addendum)
[facility] EMERGENCY DEPARTMENT Provider Note   CSN: [id number] Arrival date & time: 04/01/21  0913     History Chief Complaint  Patient presents with   Bradycardia    Larry Macdonald is a 80 y.o. male.  80 year old male with history of hypertension who presents with slow heart rate.  Over the past week, the patient has intermittently checked his blood pressure and noted that his heart rate was slow at home in the 40s.  He has had occasional mild lightheadedness but no syncope.  He has had some occasional windedness but denies any severe shortness of breath.  He feels a [name] rundown today.  No chest pain, fevers, abdominal pain, or nausea/vomiting.  No recent medication changes.  No history of heart problems.  Of note, he tested positive for COVID about 1 month ago on a home test, symptoms were very mild and have completely resolved.  The history is provided by the patient.      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 No pertinent surgical history.     History reviewed. No pertinent family history.  Social History   Tobacco Use   Smoking status: Never  Vaping Use   Vaping Use: Never used  Substance Use Topics   Alcohol use: Yes    Comment: socially   Drug use: Never    Home Medications Prior to Admission medications   Medication Sig Start Date End Date Taking? Authorizing Provider  allopurinol (ZYLOPRIM) 100 MG tablet Take 100 mg by mouth daily.   Yes [provider]  lisinopril-hydrochlorothiazide (ZESTORETIC) 20-12.5 MG tablet Take 1 tablet by mouth daily.    [provider]    Allergies    Patient has no allergy information on record.  Review of Systems   Review of Systems All other systems reviewed and are negative except that which was mentioned in HPI  Physical Exam Updated Vital  Signs BP (!) 146/63 (BP Location: Right Arm)   Pulse (!) 37   Temp 98 F (36.7 C) (Oral)   Resp 17   Ht 5\' 11"  (1.803 m)   Wt 83.9 kg   SpO2 97%   BMI 25.80 kg/m   Physical Exam Vitals and nursing note reviewed.  Constitutional:      General: He is not in acute distress.    Appearance: Normal appearance.  HENT:     Head: Normocephalic and atraumatic.  Eyes:     Conjunctiva/sclera: Conjunctivae normal.  Cardiovascular:     Rate and Rhythm: Regular rhythm. Bradycardia present.     Heart sounds: Murmur heard.  Pulmonary:     Effort: Pulmonary effort is normal.     Breath sounds: Normal breath sounds.  Abdominal:     General: Abdomen is flat. Bowel sounds are normal. There is no distension.     Palpations: Abdomen is soft.     Tenderness: There is no abdominal tenderness.  Musculoskeletal:     Right lower leg: No edema.     Left lower leg: No edema.  Skin:    General: Skin is warm and dry.  Neurological:     Mental Status: He is alert and oriented to person, place, and time.     Comments: fluent  Psychiatric:        Mood and Affect: Mood normal.  Behavior: Behavior normal.    ED Results / Procedures / Treatments   Labs (all labs ordered are listed, but only abnormal results are displayed) Labs Reviewed  COMPREHENSIVE METABOLIC PANEL - Abnormal; Notable for the following components:      Result Value   Glucose, Bld 157 (*)    Creatinine, Ser 1.57 (*)    GFR, Estimated 45 (*)    All other components within normal limits  MAGNESIUM - Abnormal; Notable for the following components:   Magnesium 1.6 (*)    All other components within normal limits  RESP PANEL BY RT-PCR (FLU A&B, COVID) [id number]  CBC WITH DIFFERENTIAL/PLATELET    EKG EKG Interpretation  Date/Time:  Monday April 01 2021 09:30:55 EDT Ventricular Rate:  39 PR Interval:    QRS Duration: 197 QT Interval:  650 QTC Calculation: 524 R Axis:   140 Text Interpretation: Complete AV block with wide  QRS complex RBBB and LPFB complete heart block new from previous Confirmed by Frederick Peers [phone]) on 04/01/2021 9:52:15 AM  Radiology DG Chest Portable 1 View  Result Date: 04/01/2021 CLINICAL DATA:  Complete heart block EXAM: PORTABLE CHEST 1 VIEW COMPARISON:  04/28/2019 FINDINGS: Artifact from defibrillator pads and EKG leads. Stable heart size and mediastinal contours. Interstitial prominence but no Kerley lines, effusion, or air bronchogram. IMPRESSION: Stable from 2020.  No pulmonary edema. Electronically Signed   By: Marnee Spring M.D.   On: 04/01/2021 10:02    Procedures .Critical Care  Date/Time: 04/01/2021 11:18 AM Performed by: Laurence Spates, MD Authorized by: Laurence Spates, MD   Critical care provider statement:    Critical care time (minutes):  30   Critical care time was exclusive of:  Separately billable procedures and treating other patients   Critical care was necessary to treat or prevent imminent or life-threatening deterioration of the following conditions:  Cardiac failure   Critical care was time spent personally by me on the following activities:  Development of treatment plan with patient or surrogate, discussions with consultants, evaluation of patient's response to treatment, examination of patient, obtaining history from patient or surrogate, ordering and performing treatments and interventions, ordering and review of laboratory studies, ordering and review of radiographic studies and re-evaluation of patient's condition   Medications Ordered in ED Medications  magnesium sulfate IVPB 2 g 50 mL (has no administration in time range)    ED Course  I have reviewed the triage vital signs and the nursing notes.  Pertinent labs & imaging results that were available during my care of the patient were reviewed by me and considered in my medical decision making (see chart for details).    MDM Rules/Calculators/A&P                          Alert and  comfortable on exam, hypertensive, heart rate 35-40.  EKG shows complete heart block which is new from previous.  Lab work shows normal potassium, magnesium mildly low at 1.6, ordered IV potassium repletion.  Have placed the patient on pacer pads although his blood pressure has been stable and he is relatively asymptomatic currently.  Discussed with cardiologist on-call, Dr. Cristal Deer, who agreed that EKG does look consistent with complete heart block.  Patient will require EP evaluation for consideration of pacemaker.  Will hold n.p.o. for now.  Discussed transfer plan with patient who is in agreement.  Of note, the patient did have COVID-19 infection 1 month ago with  mild symptoms.  He is completely asymptomatic now.  I have sent a screening COVID test in anticipation of possible procedure but have noted the possibility that he could still test positive although he is outside of quarantine window. Final Clinical Impression(s) / ED Diagnoses Final diagnoses:  Complete heart block [facility])    Rx / DC Orders ED Discharge Orders     None        Shaheer Bonfield, Ambrose Finland, MD 04/01/21 1118    Mahathi Pokorney, Ambrose Finland, MD 04/01/21 1310

## 2021-04-01 NOTE — Plan of Care (Signed)
  Problem: Education: Goal: Knowledge of General Education information will improve Description: Including pain rating scale, medication(s)/side effects and non-pharmacologic comfort measures Outcome: Progressing   Problem: Health Behavior/Discharge Planning: Goal: Ability to manage health-related needs will improve Outcome: Progressing   Problem: Clinical Measurements: Goal: Ability to maintain clinical measurements within normal limits will improve Outcome: Progressing   Problem: Clinical Measurements: Goal: Diagnostic test results will improve Outcome: Progressing   Problem: Clinical Measurements: Goal: Cardiovascular complication will be avoided Outcome: Progressing   Problem: Nutrition: Goal: Adequate nutrition will be maintained Outcome: Progressing   Problem: Pain Managment: Goal: General experience of comfort will improve Outcome: Progressing   Problem: Education: Goal: Ability to demonstrate management of disease process will improve Outcome: Progressing   Problem: Education: Goal: Ability to verbalize understanding of medication therapies will improve Outcome: Progressing

## 2021-04-01 NOTE — ED Triage Notes (Signed)
Low heart rate  of 43 at home x 1 week 40 here at [place].  States slight lightheaded noted intermittently.  No cardiac history

## 2021-04-02 ENCOUNTER — Inpatient Hospital Stay (HOSPITAL_COMMUNITY): Payer: Medicare Other

## 2021-04-02 ENCOUNTER — Inpatient Hospital Stay (HOSPITAL_COMMUNITY)
Admission: EM | Disposition: A | Discharge status: Home / Self Care | Payer: Self-pay | Source: Home / Self Care | Attending: Internal Medicine

## 2021-04-02 DIAGNOSIS — I442 Atrioventricular block, complete: Secondary | ICD-10-CM

## 2021-04-02 HISTORY — PX: PACEMAKER IMPLANT: EP1218

## 2021-04-02 LAB — CBC
HCT: 48 % (ref 39.0–52.0)
Hemoglobin: 15.2 g/dL (ref 13.0–17.0)
MCH: 29.6 pg (ref 26.0–34.0)
MCHC: 31.7 g/dL (ref 30.0–36.0)
MCV: 93.6 fL (ref 80.0–100.0)
Platelets: 188 10*3/uL (ref 150–400)
RBC: 5.13 MIL/uL (ref 4.22–5.81)
RDW: 14.6 % (ref 11.5–15.5)
WBC: 12 10*3/uL — ABNORMAL HIGH (ref 4.0–10.5)
nRBC: 0 % (ref 0.0–0.2)

## 2021-04-02 LAB — ECHOCARDIOGRAM COMPLETE
Area-P 1/2: 1.94 cm2
Calc EF: 44.8 %
Height: 71 in
MV M vel: 5.09 m/s
MV Peak grad: 103.6 mmHg
P 1/2 time: 1090 msec
Radius: 0.3 cm
S' Lateral: 4.2 cm
Single Plane A2C EF: 41.3 %
Single Plane A4C EF: 46.7 %
Weight: 2960 oz

## 2021-04-02 LAB — BASIC METABOLIC PANEL
Anion gap: 9 (ref 5–15)
BUN: 20 mg/dL (ref 8–23)
CO2: 22 mmol/L (ref 22–32)
Calcium: 8.6 mg/dL — ABNORMAL LOW (ref 8.9–10.3)
Chloride: 104 mmol/L (ref 98–111)
Creatinine, Ser: 1.67 mg/dL — ABNORMAL HIGH (ref 0.61–1.24)
GFR, Estimated: 41 mL/min — ABNORMAL LOW (ref >60)
Glucose, Bld: 122 mg/dL — ABNORMAL HIGH (ref 70–99)
Potassium: 4.1 mmol/L (ref 3.5–5.1)
Sodium: 135 mmol/L (ref 135–145)

## 2021-04-02 LAB — MAGNESIUM: Magnesium: 1.9 mg/dL (ref 1.7–2.4)

## 2021-04-02 SURGERY — PACEMAKER IMPLANT

## 2021-04-02 MED ORDER — AMLODIPINE BESYLATE 5 MG PO TABS
5.0000 mg | ORAL_TABLET | Freq: Every day | ORAL | Status: DC
  Administered 2021-04-02 – 2021-04-03 (×2): 5 mg via ORAL
  Filled 2021-04-02: qty 1

## 2021-04-02 MED ORDER — CEFAZOLIN SODIUM-DEXTROSE 2-4 GM/100ML-% IV SOLN
2.0000 g | INTRAVENOUS | Status: AC
  Administered 2021-04-02: 2 g via INTRAVENOUS
  Filled 2021-04-02: qty 100

## 2021-04-02 MED ORDER — LIDOCAINE HCL 1 % IJ SOLN
INTRAMUSCULAR | Status: AC
  Filled 2021-04-02: qty 60

## 2021-04-02 MED ORDER — SODIUM CHLORIDE 0.9 % IV SOLN: INTRAVENOUS | Status: DC

## 2021-04-02 MED ORDER — SODIUM CHLORIDE 0.9% FLUSH: 3.0000 mL | INTRAVENOUS | Status: DC | PRN

## 2021-04-02 MED ORDER — CHLORHEXIDINE GLUCONATE 4 % EX LIQD
60.0000 mL | Freq: Once | CUTANEOUS | Status: AC
  Administered 2021-04-02: 4 via TOPICAL
  Filled 2021-04-02: qty 15

## 2021-04-02 MED ORDER — CEFAZOLIN SODIUM-DEXTROSE 2-4 GM/100ML-% IV SOLN
INTRAVENOUS | Status: AC
  Filled 2021-04-02: qty 100

## 2021-04-02 MED ORDER — LIDOCAINE HCL (PF) 1 % IJ SOLN
INTRAMUSCULAR | Status: DC | PRN
  Administered 2021-04-02: 50 mL

## 2021-04-02 MED ORDER — SODIUM CHLORIDE 0.9 % IV SOLN
INTRAVENOUS | Status: AC
  Filled 2021-04-02: qty 2

## 2021-04-02 MED ORDER — ACETAMINOPHEN 325 MG PO TABS
325.0000 mg | ORAL_TABLET | ORAL | Status: DC | PRN
  Administered 2021-04-03: 650 mg via ORAL
  Filled 2021-04-02: qty 2

## 2021-04-02 MED ORDER — SODIUM CHLORIDE 0.9% FLUSH
3.0000 mL | Freq: Two times a day (BID) | INTRAVENOUS | Status: DC
Start: 2021-04-02 — End: 2021-04-02
  Administered 2021-04-02: 3 mL via INTRAVENOUS

## 2021-04-02 MED ORDER — FENTANYL CITRATE (PF) 100 MCG/2ML IJ SOLN
INTRAMUSCULAR | Status: AC
  Filled 2021-04-02: qty 2

## 2021-04-02 MED ORDER — HEPARIN (PORCINE) IN NACL 1000-0.9 UT/500ML-% IV SOLN
INTRAVENOUS | Status: DC | PRN
  Administered 2021-04-02: 500 mL

## 2021-04-02 MED ORDER — SODIUM CHLORIDE 0.9% FLUSH
3.0000 mL | Freq: Two times a day (BID) | INTRAVENOUS | Status: DC
  Administered 2021-04-02: 3 mL via INTRAVENOUS

## 2021-04-02 MED ORDER — MIDAZOLAM HCL 5 MG/5ML IJ SOLN
INTRAMUSCULAR | Status: AC
  Filled 2021-04-02: qty 5

## 2021-04-02 MED ORDER — SODIUM CHLORIDE 0.9 % IV SOLN
80.0000 mg | INTRAVENOUS | Status: AC
  Administered 2021-04-02: 80 mg
  Filled 2021-04-02 (×2): qty 2

## 2021-04-02 MED ORDER — SODIUM CHLORIDE 0.9 % IV SOLN: 250.0000 mL | INTRAVENOUS | Status: DC | PRN

## 2021-04-02 MED ORDER — CEFAZOLIN SODIUM-DEXTROSE 1-4 GM/50ML-% IV SOLN
1.0000 g | Freq: Four times a day (QID) | INTRAVENOUS | Status: AC
  Administered 2021-04-02 – 2021-04-03 (×3): 1 g via INTRAVENOUS
  Filled 2021-04-02 (×6): qty 50

## 2021-04-02 MED ORDER — HYDROCODONE-ACETAMINOPHEN 5-325 MG PO TABS: 1.0000 | ORAL_TABLET | ORAL | Status: DC | PRN

## 2021-04-02 SURGICAL SUPPLY — 13 items
CABLE SURGICAL S-101-97-12 (CABLE) ×5 IMPLANT
CATH HIS SELECTSITE C304HIS (CATHETERS) ×2 IMPLANT
KIT MICROPUNCTURE NIT STIFF (SHEATH) ×2 IMPLANT
LEAD SELECT SECURE 3830 383069 (Lead) IMPLANT
LEAD TENDRIL MRI 52CM LPA1200M (Lead) ×2 IMPLANT
PACEMAKER ASSURITY DR-RF (Pacemaker) ×2 IMPLANT
PAD PRO RADIOLUCENT 2001M-C (PAD) ×3 IMPLANT
SELECT SECURE 3830 383069 (Lead) ×3 IMPLANT
SHEATH 7FR PRELUDE SNAP 13 (SHEATH) ×2 IMPLANT
SHEATH 8FR PRELUDE SNAP 13 (SHEATH) ×2 IMPLANT
SLITTER 6232ADJ (MISCELLANEOUS) ×2 IMPLANT
TRAY PACEMAKER INSERTION (PACKS) ×3 IMPLANT
WIRE HI TORQ VERSACORE-J 145CM (WIRE) ×2 IMPLANT

## 2021-04-02 NOTE — Plan of Care (Signed)
  Problem: Education: Goal: Knowledge of General Education information will improve Description: Including pain rating scale, medication(s)/side effects and non-pharmacologic comfort measures Outcome: Progressing   Problem: Health Behavior/Discharge Planning: Goal: Ability to manage health-related needs will improve Outcome: Progressing   Problem: Clinical Measurements: Goal: Ability to maintain clinical measurements within normal limits will improve Outcome: Progressing   Problem: Clinical Measurements: Goal: Diagnostic test results will improve Outcome: Progressing   Problem: Clinical Measurements: Goal: Cardiovascular complication will be avoided Outcome: Progressing   Problem: Nutrition: Goal: Adequate nutrition will be maintained Outcome: Progressing   Problem: Pain Managment: Goal: General experience of comfort will improve Outcome: Progressing   Problem: Skin Integrity: Goal: Risk for impaired skin integrity will decrease Outcome: Progressing   Problem: Education: Goal: Ability to demonstrate management of disease process will improve Outcome: Progressing   Problem: Education: Goal: Ability to verbalize understanding of medication therapies will improve Outcome: Progressing

## 2021-04-02 NOTE — Consult Note (Addendum)
Cardiology Consultation:   Patient ID: Larry Macdonald MRN: [id number]; DOB: 12/27/1940  Admit date: 04/01/2021 Date of Consult: 04/02/2021  PCP:  Burnis Medin, PA-C   [facility] Providers Cardiologist:  new to [facility]   Patient Profile:   Larry Macdonald is a 80 y.o. male with a hx of HTN, CKD (III), gout, smoker, RBBB  who is being seen 04/02/2021 for the evaluation of CHB at the request of Dr. Cristal Deer.  History of Present Illness:   Larry Macdonald sought attention at [facility] yesterday low HRs in the 40's when he has been checking  his BP and new episodes of lightheadedness in the last few days as well, worse when miving about, less/minimal at rest. No reported CP, more easily winded of late  One reportsed hx of syncope in 2020 associated with standingup,ER visit atthattime reportedly unremarkable  Remote stress test about 30years ago reportedly normal, no cardiac hx does not see a cardiologist  He arrived hypertensive 205/97. EKG showed complete heart block, rate 39, with ST depression/ T wave inversion in inferior leads and V4-V6. Chest x-ray showed no acute findings. WBC 9.3, Hgb 16.0, Plts 159. Na 139, K 3.9, Glucose 157, BUN 20, Cr 1.57. LFTs normal. Magnesium 1.6. Respiratory panel negative for COVID and influenza A/B  Cardiology was called, noting no nodal blocking agents or reversible causes for his CHB recommended transfer to [facility] for EP evaluation and pacemaker.  He arrived yesterday evening  LABS K+ 3.9 >> 4.1 Mag 1.6 > 1.9 BUN/Creat 20/1.67 (near his baseline) WBC 14.5  (afebrile, no symptoms of illness)  H/H 17.5/55 Plts 212 TSH 3.983  Echo is ordered Repeat WBC ordered  He is unusually weak, intermittently lightheaded this past week. At rest minimal if any symptoms. He goes to the gym regularly with no CP or exertional incapacities until this week he noted on the treadmill he was unable to do his usual routine and gt weak and SOB easy. Still, no  CP   Past Medical History:  Diagnosis Date   Hypertension     History reviewed. No pertinent surgical history.   Home Medications:  Prior to Admission medications   Medication Sig Start Date End Date Taking? Authorizing Provider  allopurinol (ZYLOPRIM) 300 MG tablet Take 300 mg by mouth at bedtime.   Yes [provider]  cetirizine (ZYRTEC) 10 MG tablet Take 10 mg by mouth at bedtime.   Yes [provider]  Cholecalciferol (VITAMIN D3) 50 MCG (2000 UT) TABS Take 2,000 Units by mouth at bedtime.   Yes [provider]  lisinopril (ZESTRIL) 10 MG tablet Take 10 mg by mouth at bedtime.   Yes [provider]  acetaminophen (TYLENOL) 650 MG CR tablet Take 650-1,300 mg by mouth 2 (two) times daily as needed for pain.    [provider]  allopurinol (ZYLOPRIM) 100 MG tablet Take 100 mg by mouth daily. Patient not taking: No sig reported    [provider]    Inpatient Medications: Scheduled Meds:  allopurinol  100 mg Oral Daily   aspirin EC  81 mg Oral Daily   heparin  5,000 Units Subcutaneous Q8H   hydrALAZINE  10 mg Oral Q6H   Continuous Infusions:  PRN Meds: acetaminophen, nitroGLYCERIN, ondansetron (ZOFRAN) IV  Allergies:   No Known Allergies  Social History:   Social History   Socioeconomic History   Marital status: Married    Spouse name: Not on file   Number of children: Not  on file   Years of education: Not on file   Highest education level: Not on file  Occupational History   Not on file  Tobacco Use   Smoking status: Never   Smokeless tobacco: Not on file  Vaping Use   Vaping Use: Never used  Substance and Sexual Activity   Alcohol use: Yes    Comment: socially   Drug use: Never   Sexual activity: Not on file  Other Topics Concern   Not on file  Social History Narrative   Not on file   Social Determinants of Health   Financial Resource Strain: Not on file  Food Insecurity: Not on file   Transportation Needs: Not on file  Physical Activity: Not on file  Stress: Not on file  Social Connections: Not on file  Intimate Partner Violence: Not on file    Family History:   Family History  Problem Relation Age of Onset   Lung cancer Father    Heart disease Neg Hx      ROS:  Please see the history of present illness.  All other ROS reviewed and negative.     Physical Exam/Data:   Vitals:   04/01/21 2300 04/01/21 2350 04/02/21 0308 04/02/21 0719  BP: (!) 150/67 129/60 131/60 (!) 150/74  Pulse: (!) 38 (!) 37 (!) 36   Resp: 18 17 15 18   Temp: 98.6 F (37 C) 99.3 F (37.4 C) 98.5 F (36.9 C) 97.6 F (36.4 C)  TempSrc: Oral Oral Oral   SpO2: 95% (!) 87% 95%   Weight:      Height:        Intake/Output Summary (Last 24 hours) at 04/02/2021 0851 Last data filed at 04/01/2021 1953 Gross per 24 hour  Intake 530 ml  Output 875 ml  Net -345 ml   Last 3 Weights 04/01/2021 04/28/2019  Weight (lbs) 185 lb 185 lb  Weight (kg) 83.915 kg 83.915 kg     Body mass index is 25.8 kg/m.  General:  Well nourished, well developed, in no acute distress HEENT: normal Lymph: no adenopathy Neck: no JVD Endocrine:  No thryomegaly Vascular: No carotid bruits; FA pulses 2+ bilaterally without bruits  Cardiac:  RRR; bradycardic, no murmurs Lungs:  CTA b/l, no wheezing, rhonchi or rales  Abd: soft, nontender, no hepatomegaly  Ext: no edema Musculoskeletal:  No deformities Skin: warm and dry  Neuro:  CNs 2-12 intact, no focal abnormalities noted Psych:  Normal affect   EKG:  The EKG was personally reviewed and demonstrates:    CHB 39bpm, RBBB diffuse ST/T changes CHB 33bpm, LAD, RBBB, c/w ST/T changes, less prominent  Telemetry:  Telemetry was personally reviewed and demonstrates:   CHB 30's  Relevant CV Studies:  Echo is in progress  Laboratory Data:  High Sensitivity Troponin:  No results for input(s): TROPONINIHS in the last 720 hours.   Chemistry Recent Labs  Lab  04/01/21 0936 04/01/21 1940 04/02/21 0008  NA 139  --  135  K 3.9  --  4.1  CL 109  --  104  CO2 22  --  22  GLUCOSE 157*  --  122*  BUN 20  --  20  CREATININE 1.57* 1.65* 1.67*  CALCIUM 9.0  --  8.6*  GFRNONAA 45* 42* 41*  ANIONGAP 8  --  9    Recent Labs  Lab 04/01/21 0936  PROT 7.1  ALBUMIN 3.8  AST 30  ALT 33  ALKPHOS 75  BILITOT 1.2  Hematology Recent Labs  Lab 04/01/21 0936 04/01/21 1940  WBC 9.3 14.5*  RBC 5.33 5.86*  HGB 16.0 17.5*  HCT 48.8 55.3*  MCV 91.6 94.4  MCH 30.0 29.9  MCHC 32.8 31.6  RDW 14.6 14.6  PLT 159 212   BNPNo results for input(s): BNP, PROBNP in the last 168 hours.  DDimer No results for input(s): DDIMER in the last 168 hours.   Radiology/Studies:  DG Chest Portable 1 View  Result Date: 04/01/2021 CLINICAL DATA:  Complete heart block EXAM: PORTABLE CHEST 1 VIEW COMPARISON:  04/28/2019 FINDINGS: Artifact from defibrillator pads and EKG leads. Stable heart size and mediastinal contours. Interstitial prominence but no Kerley lines, effusion, or air bronchogram. IMPRESSION: Stable from 2020.  No pulmonary edema. Electronically Signed   By: Marnee Spring M.D.   On: 04/01/2021 10:02     Assessment and Plan:   CHB No reversible causes noted Symptomatic BP stable  Recommend PPM Await echo Follow up WBC though likely is 2/2 bradycardia/stress no symptoms of illness, no urinary symptoms, no fever  I have discussed the rational for PPM and the procedure, potential benefits/risks, he is agreeableto proceed.  SCD's, stop sq heparin  2. HTN better   Risk Assessment/Risk Scores:  {  For questions or updates, please contact [facility] Please consult [email or web address] for contact info under    Signed, Sheilah Pigeon, PA-C  04/02/2021 8:51 AM   I have seen, examined the patient, and reviewed the above assessment and plan.  Changes to above are made where necessary.  On exam,  pleasant, NAD.  Bradycardic rhythm. The patient  has symptomatic complete heart block.  I would therefore recommend pacemaker implantation at this time.  Risks, benefits, alternatives to pacemaker implantation were discussed in detail with the patient today. The patient understands that the risks include but are not limited to bleeding, infection, pneumothorax, perforation, tamponade, vascular damage, renal failure, MI, stroke, death,  and lead dislodgement and wishes to proceed.     Co Sign: Hillis Range, MD 04/02/2021 3:19 PM

## 2021-04-02 NOTE — H&P (View-Only) (Signed)
Cardiology Consultation:   Patient ID: Larry Macdonald MRN: [id number]; DOB: 12/27/1940  Admit date: 04/01/2021 Date of Consult: 04/02/2021  PCP:  Burnis Medin, PA-C   [facility] Providers Cardiologist:  new to [facility]   Patient Profile:   Larry Macdonald is a 80 y.o. male with a hx of HTN, CKD (III), gout, smoker, RBBB  who is being seen 04/02/2021 for the evaluation of CHB at the request of Dr. Cristal Deer.  History of Present Illness:   [name] sought attention at [facility] yesterday low HRs in the 40's when he has been checking  his BP and new episodes of lightheadedness in the last few days as well, worse when miving about, less/minimal at rest. No reported CP, more easily winded of late  One reportsed hx of syncope in 2020 associated with standingup,ER visit atthattime reportedly unremarkable  Remote stress test about 30years ago reportedly normal, no cardiac hx does not see a cardiologist  He arrived hypertensive 205/97. EKG showed complete heart block, rate 39, with ST depression/ T wave inversion in inferior leads and V4-V6. Chest x-ray showed no acute findings. WBC 9.3, Hgb 16.0, Plts 159. Na 139, K 3.9, Glucose 157, BUN 20, Cr 1.57. LFTs normal. Magnesium 1.6. Respiratory panel negative for COVID and influenza A/B  Cardiology was called, noting no nodal blocking agents or reversible causes for his CHB recommended transfer to [facility] for EP evaluation and pacemaker.  He arrived yesterday evening  LABS K+ 3.9 >> 4.1 Mag 1.6 > 1.9 BUN/Creat 20/1.67 (near his baseline) WBC 14.5  (afebrile, no symptoms of illness)  H/H 17.5/55 Plts 212 TSH 3.983  Echo is ordered Repeat WBC ordered  He is unusually weak, intermittently lightheaded this past week. At rest minimal if any symptoms. He goes to the gym regularly with no CP or exertional incapacities until this week he noted on the treadmill he was unable to do his usual routine and gt weak and SOB easy. Still, no  CP   Past Medical History:  Diagnosis Date   Hypertension     History reviewed. No pertinent surgical history.   Home Medications:  Prior to Admission medications   Medication Sig Start Date End Date Taking? Authorizing Provider  allopurinol (ZYLOPRIM) 300 MG tablet Take 300 mg by mouth at bedtime.   Yes [provider]  cetirizine (ZYRTEC) 10 MG tablet Take 10 mg by mouth at bedtime.   Yes [provider]  Cholecalciferol (VITAMIN D3) 50 MCG (2000 UT) TABS Take 2,000 Units by mouth at bedtime.   Yes [provider]  lisinopril (ZESTRIL) 10 MG tablet Take 10 mg by mouth at bedtime.   Yes [provider]  acetaminophen (TYLENOL) 650 MG CR tablet Take 650-1,300 mg by mouth 2 (two) times daily as needed for pain.    [provider]  allopurinol (ZYLOPRIM) 100 MG tablet Take 100 mg by mouth daily. Patient not taking: No sig reported    [provider]    Inpatient Medications: Scheduled Meds:  allopurinol  100 mg Oral Daily   aspirin EC  81 mg Oral Daily   heparin  5,000 Units Subcutaneous Q8H   hydrALAZINE  10 mg Oral Q6H   Continuous Infusions:  PRN Meds: acetaminophen, nitroGLYCERIN, ondansetron (ZOFRAN) IV  Allergies:   No Known Allergies  Social History:   Social History   Socioeconomic History   Marital status: Married    Spouse name: Not on file   Number of children: Not  on file   Years of education: Not on file   Highest education level: Not on file  Occupational History   Not on file  Tobacco Use   Smoking status: Never   Smokeless tobacco: Not on file  Vaping Use   Vaping Use: Never used  Substance and Sexual Activity   Alcohol use: Yes    Comment: socially   Drug use: Never   Sexual activity: Not on file  Other Topics Concern   Not on file  Social History Narrative   Not on file   Social Determinants of Health   Financial Resource Strain: Not on file  Food Insecurity: Not on file   Transportation Needs: Not on file  Physical Activity: Not on file  Stress: Not on file  Social Connections: Not on file  Intimate Partner Violence: Not on file    Family History:   Family History  Problem Relation Age of Onset   Lung cancer Father    Heart disease Neg Hx      ROS:  Please see the history of present illness.  All other ROS reviewed and negative.     Physical Exam/Data:   Vitals:   04/01/21 2300 04/01/21 2350 04/02/21 0308 04/02/21 0719  BP: (!) 150/67 129/60 131/60 (!) 150/74  Pulse: (!) 38 (!) 37 (!) 36   Resp: 18 17 15 18   Temp: 98.6 F (37 C) 99.3 F (37.4 C) 98.5 F (36.9 C) 97.6 F (36.4 C)  TempSrc: Oral Oral Oral   SpO2: 95% (!) 87% 95%   Weight:      Height:        Intake/Output Summary (Last 24 hours) at 04/02/2021 0851 Last data filed at 04/01/2021 1953 Gross per 24 hour  Intake 530 ml  Output 875 ml  Net -345 ml   Last 3 Weights 04/01/2021 04/28/2019  Weight (lbs) 185 lb 185 lb  Weight (kg) 83.915 kg 83.915 kg     Body mass index is 25.8 kg/m.  General:  Well nourished, well developed, in no acute distress HEENT: normal Lymph: no adenopathy Neck: no JVD Endocrine:  No thryomegaly Vascular: No carotid bruits; FA pulses 2+ bilaterally without bruits  Cardiac:  RRR; bradycardic, no murmurs Lungs:  CTA b/l, no wheezing, rhonchi or rales  Abd: soft, nontender, no hepatomegaly  Ext: no edema Musculoskeletal:  No deformities Skin: warm and dry  Neuro:  CNs 2-12 intact, no focal abnormalities noted Psych:  Normal affect   EKG:  The EKG was personally reviewed and demonstrates:    CHB 39bpm, RBBB diffuse ST/T changes CHB 33bpm, LAD, RBBB, c/w ST/T changes, less prominent  Telemetry:  Telemetry was personally reviewed and demonstrates:   CHB 30's  Relevant CV Studies:  Echo is in progress  Laboratory Data:  High Sensitivity Troponin:  No results for input(s): TROPONINIHS in the last 720 hours.   Chemistry Recent Labs  Lab  04/01/21 0936 04/01/21 1940 04/02/21 0008  NA 139  --  135  K 3.9  --  4.1  CL 109  --  104  CO2 22  --  22  GLUCOSE 157*  --  122*  BUN 20  --  20  CREATININE 1.57* 1.65* 1.67*  CALCIUM 9.0  --  8.6*  GFRNONAA 45* 42* 41*  ANIONGAP 8  --  9    Recent Labs  Lab 04/01/21 0936  PROT 7.1  ALBUMIN 3.8  AST 30  ALT 33  ALKPHOS 75  BILITOT 1.2  Hematology Recent Labs  Lab 04/01/21 0936 04/01/21 1940  WBC 9.3 14.5*  RBC 5.33 5.86*  HGB 16.0 17.5*  HCT 48.8 55.3*  MCV 91.6 94.4  MCH 30.0 29.9  MCHC 32.8 31.6  RDW 14.6 14.6  PLT 159 212   BNPNo results for input(s): BNP, PROBNP in the last 168 hours.  DDimer No results for input(s): DDIMER in the last 168 hours.   Radiology/Studies:  DG Chest Portable 1 View  Result Date: 04/01/2021 CLINICAL DATA:  Complete heart block EXAM: PORTABLE CHEST 1 VIEW COMPARISON:  04/28/2019 FINDINGS: Artifact from defibrillator pads and EKG leads. Stable heart size and mediastinal contours. Interstitial prominence but no Kerley lines, effusion, or air bronchogram. IMPRESSION: Stable from 2020.  No pulmonary edema. Electronically Signed   By: Marnee Spring M.D.   On: 04/01/2021 10:02     Assessment and Plan:   CHB No reversible causes noted Symptomatic BP stable  Recommend PPM Await echo Follow up WBC though likely is 2/2 bradycardia/stress no symptoms of illness, no urinary symptoms, no fever  I have discussed the rational for PPM and the procedure, potential benefits/risks, he is agreeableto proceed.  SCD's, stop sq heparin  2. HTN better   Risk Assessment/Risk Scores:  {  For questions or updates, please contact [facility] Please consult [email or web address] for contact info under    Signed, Sheilah Pigeon, PA-C  04/02/2021 8:51 AM   I have seen, examined the patient, and reviewed the above assessment and plan.  Changes to above are made where necessary.  On exam,  pleasant, NAD.  Bradycardic rhythm. The patient  has symptomatic complete heart block.  I would therefore recommend pacemaker implantation at this time.  Risks, benefits, alternatives to pacemaker implantation were discussed in detail with the patient today. The patient understands that the risks include but are not limited to bleeding, infection, pneumothorax, perforation, tamponade, vascular damage, renal failure, MI, stroke, death,  and lead dislodgement and wishes to proceed.     Co Sign: Hillis Range, MD 04/02/2021 3:19 PM

## 2021-04-02 NOTE — Discharge Instructions (Signed)
    Supplemental Discharge Instructions for  Pacemaker/Defibrillator Patients   Activity No heavy lifting or vigorous activity with your left/right arm for 6 to 8 weeks.  Do not raise your left/right arm above your head for one week.  Gradually raise your affected arm as drawn below.          04/10/21                       04/11/21                    04/12/21                  04/13/21 __  NO DRIVING until cleared to at your wound check visist.  WOUND CARE Keep the wound area clean and dry.  Do not get this area wet , no showers until cleared to at your wound check visit . The tape/steri-strips on your wound will fall off; do not pull them off.  No bandage is needed on the site.  DO  NOT apply any creams, oils, or ointments to the wound area. If you notice any drainage or discharge from the wound, any swelling or bruising at the site, or you develop a fever > 101? F after you are discharged home, call the office at once.  Special Instructions You are still able to use cellular telephones; use the ear opposite the side where you have your pacemaker/defibrillator.  Avoid carrying your cellular phone near your device. When traveling through airports, show security personnel your identification card to avoid being screened in the metal detectors.  Ask the security personnel to use the hand wand. Avoid arc welding equipment, MRI testing (magnetic resonance imaging), TENS units (transcutaneous nerve stimulators).  Call the office for questions about other devices. Avoid electrical appliances that are in poor condition or are not properly grounded. Microwave ovens are safe to be near or to operate.

## 2021-04-02 NOTE — Progress Notes (Signed)
  Echocardiogram 2D Echocardiogram has been performed.  Larry Macdonald 04/02/2021, 9:35 AM

## 2021-04-02 NOTE — Interval H&P Note (Signed)
History and Physical Interval Note:  04/02/2021 3:20 PM  Larry Macdonald  has presented today for surgery, with the diagnosis of heart block.  The various methods of treatment have been discussed with the patient and family. After consideration of risks, benefits and other options for treatment, the patient has consented to  Procedure(s): PACEMAKER IMPLANT (N/A) as a surgical intervention.  The patient's history has been reviewed, patient examined, no change in status, stable for surgery.  I have reviewed the patient's chart and labs.  Questions were answered to the patient's satisfaction.     Hillis Range

## 2021-04-03 ENCOUNTER — Encounter (HOSPITAL_COMMUNITY): Payer: Self-pay | Admitting: Internal Medicine

## 2021-04-03 ENCOUNTER — Inpatient Hospital Stay (HOSPITAL_COMMUNITY): Payer: Medicare Other

## 2021-04-03 MED ORDER — CARVEDILOL 3.125 MG PO TABS: 3.1250 mg | ORAL_TABLET | Freq: Two times a day (BID) | ORAL | 5 refills | Status: DC

## 2021-04-03 MED FILL — Lidocaine HCl Local Inj 1%: INTRAMUSCULAR | Qty: 50 | Status: AC

## 2021-04-03 NOTE — Plan of Care (Signed)

## 2021-04-03 NOTE — Discharge Summary (Addendum)
ELECTROPHYSIOLOGY PROCEDURE DISCHARGE SUMMARY    Patient ID: Larry Macdonald,  MRN: [id number], DOB/AGE: 11-22-1940 80 y.o.  Admit date: 04/01/2021 Discharge date: 04/03/2021  Primary Care Physician: Gillian Scarce  Primary Cardiologist/Electrophysiologist: new to [facility], EP: Dr. Johney Frame  Primary Discharge Diagnosis:  CHB CM  Secondary Discharge Diagnosis:  HTN CKD (III) gout  No Known Allergies   Procedures This Admission:  1.  Implantation of a Abbott dual chamber PPM on 04/02/21 by Dr Johney Frame.   There were no immediate post procedure complications. CXR on 04/03/21 demonstrated no pneumothorax status post device implantation.   Brief HPI: Larry Macdonald is a 80 y.o. male w/PMHx above was seen initially [facility] with unusual new episodes of lightheadedness in the last few days as well, worse when moving about, less/minimal at rest also observed his HR unusually low in the 40's, no CP, though easily winded, he was found in CHB and transferred to [facility] for further evaluation and management.   Hospital course:  The patient was admitted by cardiology, not felt or suspect to be ischemic, EP consulted with no reversible causes recommended PPM implant.  TTE noted mildly reduced LVEF 40-45% w/global hypokinesis.  He underwent implantation of a PPM (with L bundle RV pacing , pelase see procedure report for full details.  He was monitored on telemetry overnight which demonstrated SR/Vpacing and AV pacing.  Left chest was without hematoma or ecchymosis.  The device was interrogated and found to be functioning normally.  CXR was obtained and demonstrated no pneumothorax status post device implantation.  Wound care, arm mobility, and restrictions were reviewed with the patient.  The patient feels well, denies any CP/SOB, with minimal site discomfort last evening only that has resolved.  He was examined by Dr. Johney Frame and considered stable for discharge to home.   Will resume his home  lisinopril, add small coreg dose for his mild CM   Physical Exam: Vitals:   04/03/21 0036 04/03/21 0319 04/03/21 0624 04/03/21 0721  BP: (!) 174/84 (!) 171/98 (!) 160/86 (!) 166/88  Pulse:  69  61  Resp:  17  (!) 25  Temp:  98.3 F (36.8 C)  97.6 F (36.4 C)  TempSrc:  Oral  Oral  SpO2:  90%  92%  Weight:      Height:        GEN- The patient is well appearing, alert and oriented x 3 today.   HEENT: normocephalic, atraumatic; sclera clear, conjunctiva pink; hearing intact; oropharynx clear; neck supple, no JVP Lungs- CTA b/l, normal work of breathing.  No wheezes, rales, rhonchi Heart- RRR, no murmurs, rubs or gallops, PMI not laterally displaced GI- soft, non-tender, non-distended Extremities- no clubbing, cyanosis, or edema MS- no significant deformity or atrophy Skin- warm and dry, no rash or lesion,  left chest without hematoma/ecchymosis Psych- euthymic mood, full affect Neuro- no gross deficits   Labs:   Lab Results  Component Value Date   WBC 12.0 (H) 04/02/2021   HGB 15.2 04/02/2021   HCT 48.0 04/02/2021   MCV 93.6 04/02/2021   PLT 188 04/02/2021    Recent Labs  Lab 04/01/21 0936 04/01/21 1940 04/02/21 0008  NA 139  --  135  K 3.9  --  4.1  CL 109  --  104  CO2 22  --  22  BUN 20  --  20  CREATININE 1.57*   < > 1.67*  CALCIUM 9.0  --  8.6*  PROT  7.1  --   --   BILITOT 1.2  --   --   ALKPHOS 75  --   --   ALT 33  --   --   AST 30  --   --   GLUCOSE 157*  --  122*   < > = values in this interval not displayed.    Discharge Medications:  Allergies as of 04/03/2021   No Known Allergies      Medication List     TAKE these medications    acetaminophen 650 MG CR tablet Commonly known as: TYLENOL Take 650-1,300 mg by mouth 2 (two) times daily as needed for pain.   allopurinol 300 MG tablet Commonly known as: ZYLOPRIM Take 300 mg by mouth at bedtime.   carvedilol 3.125 MG tablet Commonly known as: Coreg Take 1 tablet (3.125 mg total) by  mouth 2 (two) times daily.   cetirizine 10 MG tablet Commonly known as: ZYRTEC Take 10 mg by mouth at bedtime.   lisinopril 10 MG tablet Commonly known as: ZESTRIL Take 10 mg by mouth at bedtime.   Vitamin D3 50 MCG (2000 UT) Tabs Take 2,000 Units by mouth at bedtime.       ASK your doctor about these medications    allopurinol 100 MG tablet Commonly known as: ZYLOPRIM Take 100 mg by mouth daily.        Disposition: Home Discharge Instructions     Diet - low sodium heart healthy   Complete by: As directed    Increase activity slowly   Complete by: As directed        Follow-up Information     [facility] Office Follow up.   Specialty: Cardiology Why: 04/16/21 @ 2:00PM, wound check visit Contact information: [address] [place] [address] [phone]        Hillis Range, MD .   Specialty: Cardiology Why: 07/08/21 @ 3:00PM Contact information: [address] [place] [address] [phone]                 Duration of Discharge Encounter: Greater than 30 minutes including physician time.  SignedFrancis Dowse, PA-C 04/03/2021 9:46 AM   I have seen, examined the patient, and reviewed the above assessment and plan.  Changes to above are made where necessary.  On exam, RRR.  CXR reveals stable leads, no ptx.  Device interrogation personally reviewed and normal  Co Sign: Hillis Range, MD 04/03/2021 11:02 AM

## 2021-04-16 ENCOUNTER — Ambulatory Visit (INDEPENDENT_AMBULATORY_CARE_PROVIDER_SITE_OTHER): Payer: Medicare Other

## 2021-04-16 ENCOUNTER — Other Ambulatory Visit: Payer: Self-pay

## 2021-04-16 DIAGNOSIS — I442 Atrioventricular block, complete: Secondary | ICD-10-CM | POA: Diagnosis not present

## 2021-04-16 LAB — CUP PACEART INCLINIC DEVICE CHECK
Battery Remaining Longevity: 54 mo
Battery Voltage: 3.08 V
Brady Statistic RA Percent Paced: 80 %
Brady Statistic RV Percent Paced: 99.95 %
Date Time Interrogation Session: 20220628160245
Implantable Lead Implant Date: 20220614
Implantable Lead Implant Date: 20220614
Implantable Lead Location: 753859
Implantable Lead Location: 753860
Implantable Lead Model: 3830
Implantable Pulse Generator Implant Date: 20220614
Lead Channel Impedance Value: 375 Ohm
Lead Channel Impedance Value: 625 Ohm
Lead Channel Pacing Threshold Amplitude: 0.5 V
Lead Channel Pacing Threshold Amplitude: 0.75 V
Lead Channel Pacing Threshold Pulse Width: 0.5 ms
Lead Channel Pacing Threshold Pulse Width: 0.5 ms
Lead Channel Sensing Intrinsic Amplitude: 5 mV
Lead Channel Setting Pacing Amplitude: 3.5 V
Lead Channel Setting Pacing Amplitude: 5 V
Lead Channel Setting Pacing Pulse Width: 0.5 ms
Lead Channel Setting Sensing Sensitivity: 4 mV
Pulse Gen Model: 2272
Pulse Gen Serial Number: 3933533

## 2021-04-16 NOTE — Progress Notes (Signed)
Wound check appointment. Steri-strips removed. Wound without redness or edema. Incision edges approximated, wound well healed. Normal device function. Thresholds, sensing, and impedances consistent with implant measurements. RA programmed at 3.5V & RV 5.0V programmed on for extra safety margin until 3 month visit. Histogram distribution appropriate for patient and level of activity. No mode switches or high ventricular rates noted. Patient educated about wound care, arm mobility, lifting restrictions. ROV in 3 months with implanting physician.

## 2021-07-04 ENCOUNTER — Ambulatory Visit (INDEPENDENT_AMBULATORY_CARE_PROVIDER_SITE_OTHER): Payer: Medicare Other

## 2021-07-04 DIAGNOSIS — I442 Atrioventricular block, complete: Secondary | ICD-10-CM | POA: Diagnosis not present

## 2021-07-06 LAB — CUP PACEART REMOTE DEVICE CHECK
Battery Remaining Longevity: 52 mo
Battery Remaining Percentage: 95.5 %
Battery Voltage: 2.99 V
Brady Statistic AP VP Percent: 74 %
Brady Statistic AP VS Percent: 1 %
Brady Statistic AS VP Percent: 26 %
Brady Statistic AS VS Percent: 1 %
Brady Statistic RA Percent Paced: 74 %
Brady Statistic RV Percent Paced: 99 %
Date Time Interrogation Session: 20220915035113
Implantable Lead Implant Date: 20220614
Implantable Lead Implant Date: 20220614
Implantable Lead Location: 753859
Implantable Lead Location: 753860
Implantable Lead Model: 3830
Implantable Pulse Generator Implant Date: 20220614
Lead Channel Impedance Value: 390 Ohm
Lead Channel Impedance Value: 600 Ohm
Lead Channel Pacing Threshold Amplitude: 0.5 V
Lead Channel Pacing Threshold Amplitude: 0.75 V
Lead Channel Pacing Threshold Pulse Width: 0.5 ms
Lead Channel Pacing Threshold Pulse Width: 0.5 ms
Lead Channel Sensing Intrinsic Amplitude: 12 mV
Lead Channel Sensing Intrinsic Amplitude: 5 mV
Lead Channel Setting Pacing Amplitude: 3.5 V
Lead Channel Setting Pacing Amplitude: 5 V
Lead Channel Setting Pacing Pulse Width: 0.5 ms
Lead Channel Setting Sensing Sensitivity: 4 mV
Pulse Gen Model: 2272
Pulse Gen Serial Number: 3933533

## 2021-07-08 ENCOUNTER — Ambulatory Visit: Payer: Medicare Other | Admitting: Internal Medicine

## 2021-07-08 ENCOUNTER — Encounter: Payer: Self-pay | Admitting: Internal Medicine

## 2021-07-08 ENCOUNTER — Other Ambulatory Visit: Payer: Self-pay

## 2021-07-08 VITALS — BP 160/80 | HR 60 | Ht 71.0 in | Wt 184.4 lb

## 2021-07-08 DIAGNOSIS — I442 Atrioventricular block, complete: Secondary | ICD-10-CM | POA: Diagnosis not present

## 2021-07-08 NOTE — Progress Notes (Signed)
      Primary EP:  Dr Johney Frame  Larry Macdonald is a 80 y.o. male who presents today for routine electrophysiology followup.  Since his pacemaker implant, the patient reports doing very well.  Today, he denies symptoms of palpitations, chest pain, shortness of breath,  lower extremity edema, dizziness, presyncope, or syncope.  The patient is otherwise without complaint today.   Past Medical History:  Diagnosis Date   Complete heart block (HCC)    s/p PPM (Abbott)   Hypertension    Past Surgical History:  Procedure Laterality Date   PACEMAKER IMPLANT N/A 04/02/2021   Procedure: PACEMAKER IMPLANT;  Surgeon: Hillis Range, MD;  Location: [facility] INVASIVE CV LAB;  Service: Cardiovascular;  Laterality: N/A;    ROS- all systems are reviewed and negative except as per HPI above  Current Outpatient Medications  Medication Sig Dispense Refill   acetaminophen (TYLENOL) 650 MG CR tablet Take 650-1,300 mg by mouth 2 (two) times daily as needed for pain.     allopurinol (ZYLOPRIM) 100 MG tablet Take 100 mg by mouth daily. (Patient not taking: No sig reported)     allopurinol (ZYLOPRIM) 300 MG tablet Take 300 mg by mouth at bedtime.     carvedilol (COREG) 3.125 MG tablet Take 1 tablet (3.125 mg total) by mouth 2 (two) times daily. 60 tablet 5   cetirizine (ZYRTEC) 10 MG tablet Take 10 mg by mouth at bedtime.     Cholecalciferol (VITAMIN D3) 50 MCG (2000 UT) TABS Take 2,000 Units by mouth at bedtime.     lisinopril (ZESTRIL) 10 MG tablet Take 10 mg by mouth at bedtime.     No current facility-administered medications for this visit.    Physical Exam: There were no vitals filed for this visit.  GEN- The patient is well appearing, alert and oriented x 3 today.   Head- normocephalic, atraumatic Eyes-  Sclera clear, conjunctiva pink Ears- hearing intact Oropharynx- clear Lungs- Clear to ausculation bilaterally, normal work of breathing Chest- pacemaker pocket is well healed Heart- Regular rate and  rhythm, no murmurs, rubs or gallops, PMI not laterally displaced GI- soft, NT, ND, + BS Extremities- no clubbing, cyanosis, or edema  Pacemaker interrogation- reviewed in detail today,  See PACEART report  ekg tracing ordered today is personally reviewed and shows AV paced  Assessment and Plan:  1. Symptomatic complete heart block Normal pacemaker function Conduction system pacing was performed See Pace Art report No changes today he is device dependant today  2. HTN Stable No change required today   Risks, benefits and potential toxicities for medications prescribed and/or refilled reviewed with patient today.   Hillis Range MD, [facility] 07/08/2021 3:11 PM

## 2021-07-08 NOTE — Patient Instructions (Addendum)
Medication Instructions:  Your physician recommends that you continue on your current medications as directed. Please refer to the Current Medication list given to you today.  Labwork: None ordered.  Testing/Procedures: None ordered.  Follow-Up: Your physician wants you to follow-up in: 12 months with  Hillis Range, MD or one of the following Advanced Practice Providers on your designated Care Team:     Casimiro Needle "Mardelle Matte" Lanna P[place], [place]   You will receive a reminder letter in the mail two months in advance. If you don't receive a letter, please call our office to schedule the follow-up appointment.  Remote monitoring is used to monitor your Pacemaker from home. This monitoring reduces the number of office visits required to check your device to one time per year. It allows [place] to keep an eye on the functioning of your device to ensure it is working properly. You are scheduled for a device check from home on 10/03/21. You may send your transmission at any time that day. If you have a wireless device, the transmission will be sent automatically. After your physician reviews your transmission, you will receive a postcard with your next transmission date.  Any Other Special Instructions Will Be Listed Below (If Applicable).  If you need a refill on your cardiac medications before your next appointment, please call your pharmacy.

## 2021-07-11 NOTE — Progress Notes (Signed)
Remote pacemaker transmission.   

## 2021-07-30 ENCOUNTER — Other Ambulatory Visit: Payer: Self-pay | Admitting: Physician Assistant

## 2021-09-30 ENCOUNTER — Encounter: Payer: Self-pay | Admitting: Internal Medicine

## 2021-10-03 ENCOUNTER — Ambulatory Visit (INDEPENDENT_AMBULATORY_CARE_PROVIDER_SITE_OTHER): Payer: Medicare Other

## 2021-10-03 DIAGNOSIS — I442 Atrioventricular block, complete: Secondary | ICD-10-CM

## 2021-10-03 LAB — CUP PACEART REMOTE DEVICE CHECK
Battery Remaining Longevity: 98 mo
Battery Remaining Percentage: 95.5 %
Battery Voltage: 3.01 V
Brady Statistic AP VP Percent: 71 %
Brady Statistic AP VS Percent: 1 %
Brady Statistic AS VP Percent: 29 %
Brady Statistic AS VS Percent: 1 %
Brady Statistic RA Percent Paced: 70 %
Brady Statistic RV Percent Paced: 99 %
Date Time Interrogation Session: 20221215113310
Implantable Lead Implant Date: 20220614
Implantable Lead Implant Date: 20220614
Implantable Lead Location: 753859
Implantable Lead Location: 753860
Implantable Lead Model: 3830
Implantable Pulse Generator Implant Date: 20220614
Lead Channel Impedance Value: 390 Ohm
Lead Channel Impedance Value: 630 Ohm
Lead Channel Pacing Threshold Amplitude: 0.5 V
Lead Channel Pacing Threshold Amplitude: 0.75 V
Lead Channel Pacing Threshold Pulse Width: 0.5 ms
Lead Channel Pacing Threshold Pulse Width: 0.5 ms
Lead Channel Sensing Intrinsic Amplitude: 5 mV
Lead Channel Sensing Intrinsic Amplitude: 9.7 mV
Lead Channel Setting Pacing Amplitude: 2 V
Lead Channel Setting Pacing Amplitude: 2.5 V
Lead Channel Setting Pacing Pulse Width: 0.5 ms
Lead Channel Setting Sensing Sensitivity: 4 mV
Pulse Gen Model: 2272
Pulse Gen Serial Number: 3933533

## 2021-10-15 NOTE — Progress Notes (Signed)
Remote pacemaker transmission.   

## 2021-11-04 IMAGING — CR DG CHEST 2V
2 series · 2 of 2 positions shown · non-contrast
Comparison: Portable exam of 04/01/2021

CLINICAL DATA: Pacemaker placement

EXAM:
CHEST - 2 VIEW

[chest pa]
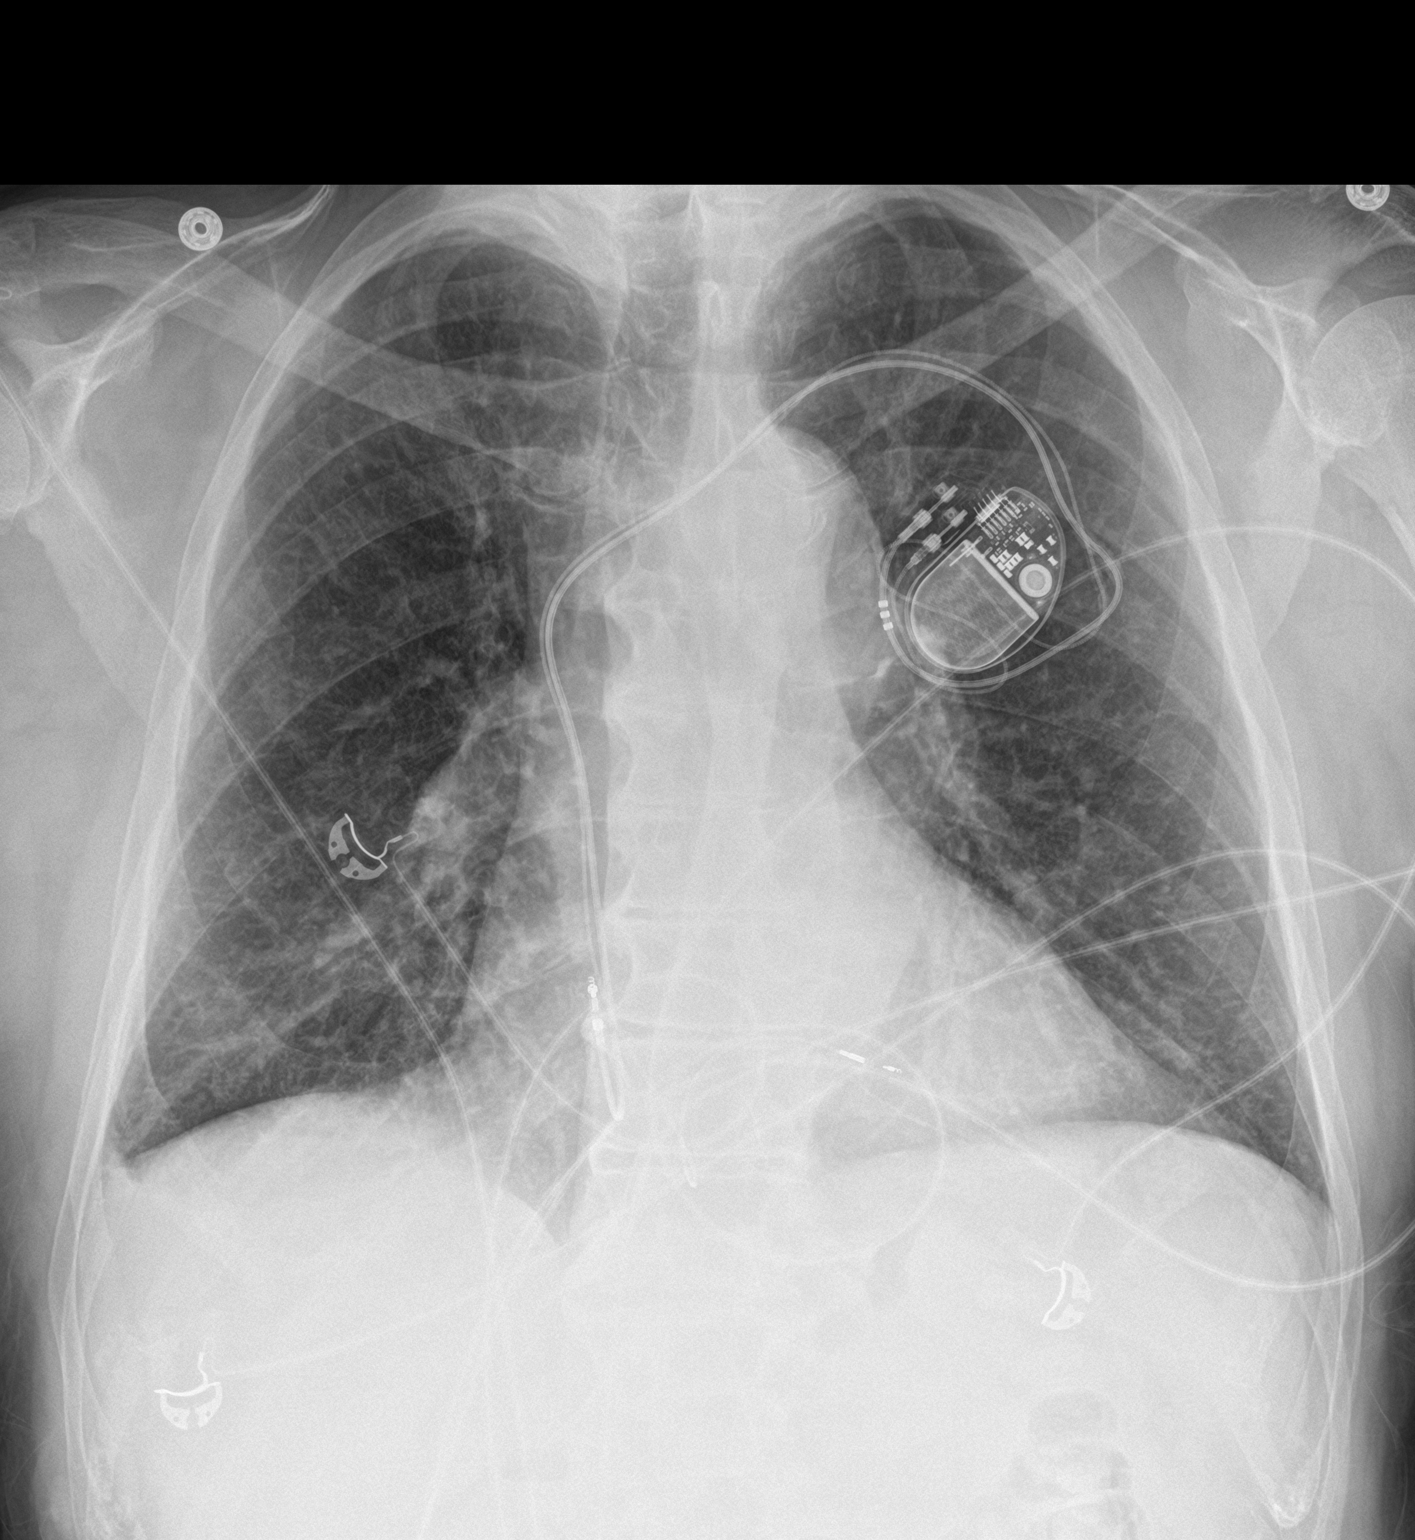

[chest lat]
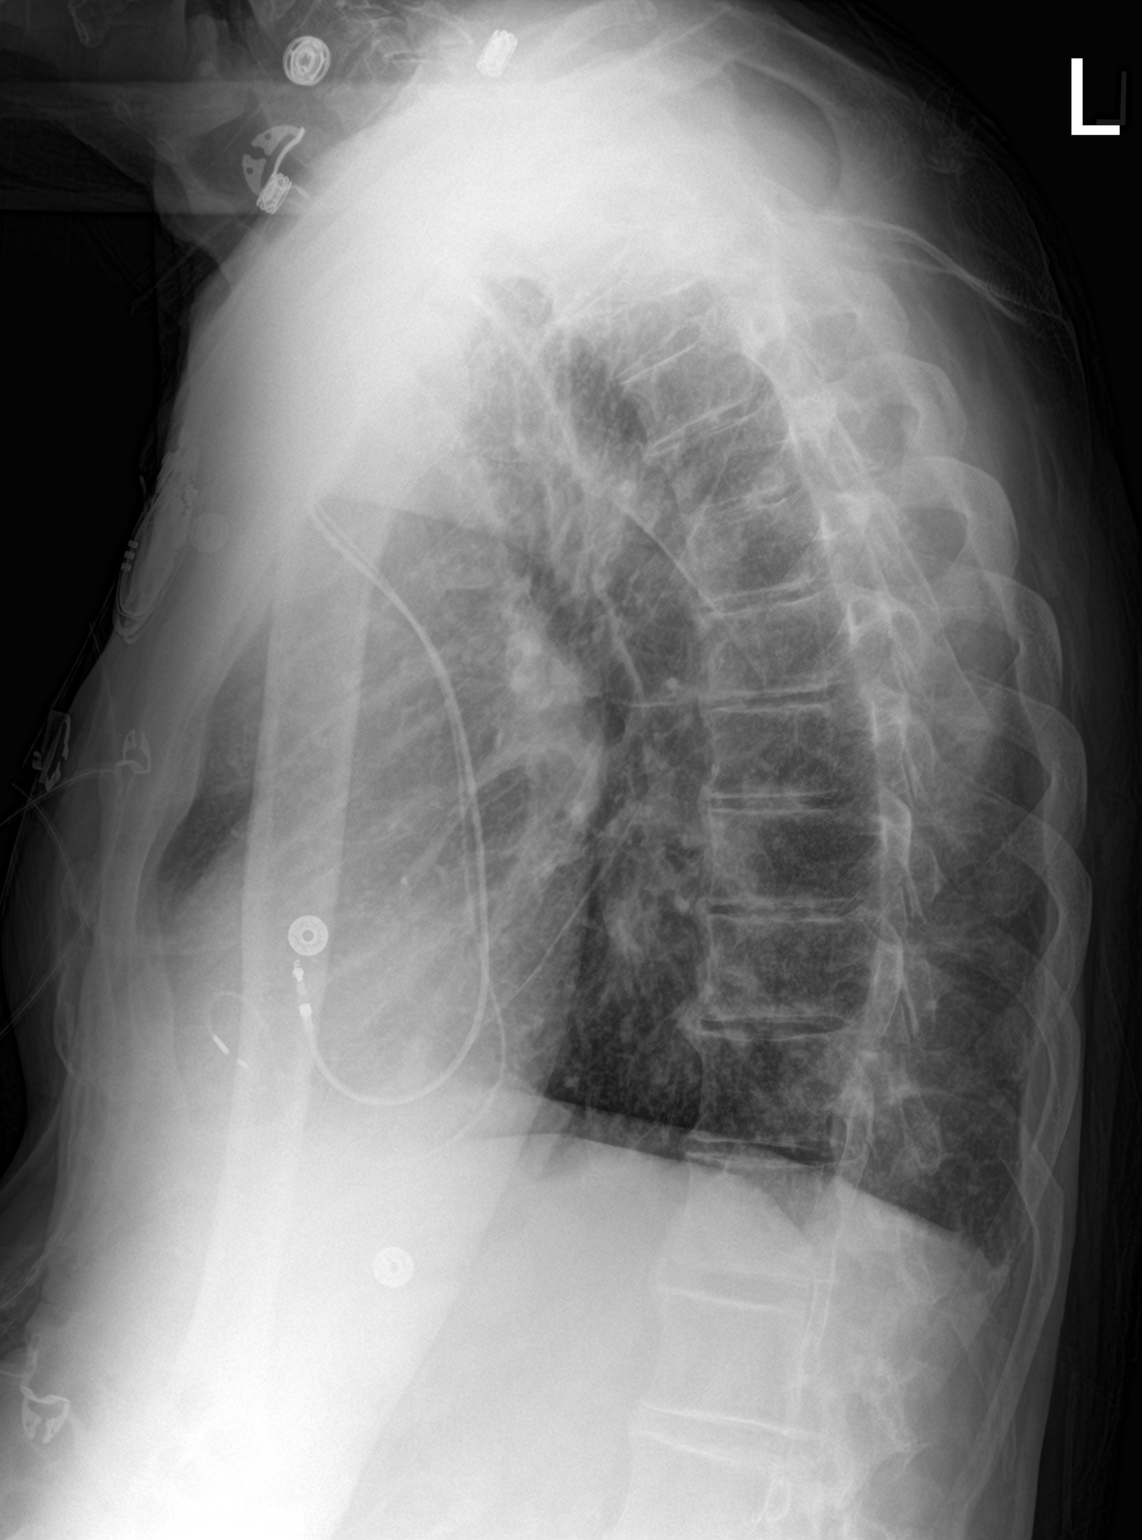

[2 of 2 positions shown; findings below may reference images not displayed]

FINDINGS: LEFT subclavian pacemaker with leads projecting at RIGHT atrium and
RIGHT ventricle.

Upper normal heart size.

Normal pulmonary vascularity.

Calcified tortuous thoracic aorta.

Mild chronic bronchitic and interstitial changes.

No infiltrate, pleural effusion, or pneumothorax.
IMPRESSION: Pacemaker without pneumothorax.

Mild chronic lung changes without acute abnormalities.

## 2022-01-02 ENCOUNTER — Ambulatory Visit (INDEPENDENT_AMBULATORY_CARE_PROVIDER_SITE_OTHER): Payer: Medicare Other

## 2022-01-02 DIAGNOSIS — I442 Atrioventricular block, complete: Secondary | ICD-10-CM | POA: Diagnosis not present

## 2022-01-02 LAB — CUP PACEART REMOTE DEVICE CHECK
Battery Remaining Longevity: 95 mo
Battery Remaining Percentage: 93 %
Battery Voltage: 3.01 V
Brady Statistic AP VP Percent: 79 %
Brady Statistic AP VS Percent: 1 %
Brady Statistic AS VP Percent: 20 %
Brady Statistic AS VS Percent: 1 %
Brady Statistic RA Percent Paced: 79 %
Brady Statistic RV Percent Paced: 99 %
Date Time Interrogation Session: 20230316071615
Implantable Lead Implant Date: 20220614
Implantable Lead Implant Date: 20220614
Implantable Lead Location: 753859
Implantable Lead Location: 753860
Implantable Lead Model: 3830
Implantable Pulse Generator Implant Date: 20220614
Lead Channel Impedance Value: 410 Ohm
Lead Channel Impedance Value: 600 Ohm
Lead Channel Pacing Threshold Amplitude: 0.5 V
Lead Channel Pacing Threshold Amplitude: 0.75 V
Lead Channel Pacing Threshold Pulse Width: 0.5 ms
Lead Channel Pacing Threshold Pulse Width: 0.5 ms
Lead Channel Sensing Intrinsic Amplitude: 12 mV
Lead Channel Sensing Intrinsic Amplitude: 5 mV
Lead Channel Setting Pacing Amplitude: 2 V
Lead Channel Setting Pacing Amplitude: 2.5 V
Lead Channel Setting Pacing Pulse Width: 0.5 ms
Lead Channel Setting Sensing Sensitivity: 4 mV
Pulse Gen Model: 2272
Pulse Gen Serial Number: 3933533

## 2022-01-10 NOTE — Progress Notes (Signed)
Remote pacemaker transmission.   

## 2022-04-03 ENCOUNTER — Ambulatory Visit (INDEPENDENT_AMBULATORY_CARE_PROVIDER_SITE_OTHER): Payer: Medicare Other

## 2022-04-03 DIAGNOSIS — I442 Atrioventricular block, complete: Secondary | ICD-10-CM

## 2022-04-08 LAB — CUP PACEART REMOTE DEVICE CHECK
Battery Remaining Longevity: 92 mo
Battery Remaining Percentage: 90 %
Battery Voltage: 3.01 V
Brady Statistic AP VP Percent: 83 %
Brady Statistic AP VS Percent: 1 %
Brady Statistic AS VP Percent: 17 %
Brady Statistic AS VS Percent: 1 %
Brady Statistic RA Percent Paced: 83 %
Brady Statistic RV Percent Paced: 99 %
Date Time Interrogation Session: 20230619150301
Implantable Lead Implant Date: 20220614
Implantable Lead Implant Date: 20220614
Implantable Lead Location: 753859
Implantable Lead Location: 753860
Implantable Lead Model: 3830
Implantable Pulse Generator Implant Date: 20220614
Lead Channel Impedance Value: 400 Ohm
Lead Channel Impedance Value: 600 Ohm
Lead Channel Pacing Threshold Amplitude: 0.5 V
Lead Channel Pacing Threshold Amplitude: 0.75 V
Lead Channel Pacing Threshold Pulse Width: 0.5 ms
Lead Channel Pacing Threshold Pulse Width: 0.5 ms
Lead Channel Sensing Intrinsic Amplitude: 5 mV
Lead Channel Sensing Intrinsic Amplitude: 9.6 mV
Lead Channel Setting Pacing Amplitude: 2 V
Lead Channel Setting Pacing Amplitude: 2.5 V
Lead Channel Setting Pacing Pulse Width: 0.5 ms
Lead Channel Setting Sensing Sensitivity: 4 mV
Pulse Gen Model: 2272
Pulse Gen Serial Number: 3933533

## 2022-04-11 NOTE — Progress Notes (Signed)
Remote pacemaker transmission.   

## 2022-04-21 ENCOUNTER — Other Ambulatory Visit: Payer: Self-pay | Admitting: Internal Medicine

## 2022-05-21 ENCOUNTER — Encounter (HOSPITAL_COMMUNITY)
Admission: EM | Disposition: A | Discharge status: Home / Self Care | Payer: Self-pay | Source: Home / Self Care | Attending: Emergency Medicine

## 2022-05-21 ENCOUNTER — Encounter (HOSPITAL_BASED_OUTPATIENT_CLINIC_OR_DEPARTMENT_OTHER): Payer: Self-pay

## 2022-05-21 ENCOUNTER — Emergency Department (HOSPITAL_BASED_OUTPATIENT_CLINIC_OR_DEPARTMENT_OTHER): Payer: Medicare Other | Admitting: Certified Registered Nurse Anesthetist

## 2022-05-21 ENCOUNTER — Emergency Department (HOSPITAL_BASED_OUTPATIENT_CLINIC_OR_DEPARTMENT_OTHER): Payer: Medicare Other

## 2022-05-21 ENCOUNTER — Emergency Department (HOSPITAL_COMMUNITY): Payer: Medicare Other

## 2022-05-21 ENCOUNTER — Observation Stay (HOSPITAL_BASED_OUTPATIENT_CLINIC_OR_DEPARTMENT_OTHER)
Admission: EM | Admit: 2022-05-21 | Discharge: 2022-05-22 | Disposition: A | Discharge status: Home / Self Care | Payer: Medicare Other | Attending: Urology | Admitting: Urology

## 2022-05-21 ENCOUNTER — Emergency Department (HOSPITAL_COMMUNITY): Payer: Medicare Other | Admitting: Certified Registered Nurse Anesthetist

## 2022-05-21 DIAGNOSIS — R109 Unspecified abdominal pain: Secondary | ICD-10-CM | POA: Diagnosis present

## 2022-05-21 DIAGNOSIS — Z950 Presence of cardiac pacemaker: Secondary | ICD-10-CM | POA: Diagnosis not present

## 2022-05-21 DIAGNOSIS — N189 Chronic kidney disease, unspecified: Secondary | ICD-10-CM | POA: Diagnosis not present

## 2022-05-21 DIAGNOSIS — Z189 Retained foreign body fragments, unspecified material: Secondary | ICD-10-CM

## 2022-05-21 DIAGNOSIS — N201 Calculus of ureter: Secondary | ICD-10-CM | POA: Diagnosis not present

## 2022-05-21 DIAGNOSIS — I10 Essential (primary) hypertension: Secondary | ICD-10-CM

## 2022-05-21 DIAGNOSIS — N179 Acute kidney failure, unspecified: Secondary | ICD-10-CM

## 2022-05-21 DIAGNOSIS — I129 Hypertensive chronic kidney disease with stage 1 through stage 4 chronic kidney disease, or unspecified chronic kidney disease: Secondary | ICD-10-CM | POA: Diagnosis not present

## 2022-05-21 DIAGNOSIS — Z79899 Other long term (current) drug therapy: Secondary | ICD-10-CM | POA: Diagnosis not present

## 2022-05-21 HISTORY — PX: CYSTOSCOPY/URETEROSCOPY/HOLMIUM LASER/STENT PLACEMENT: SHX6546

## 2022-05-21 LAB — COMPREHENSIVE METABOLIC PANEL
ALT: 12 U/L (ref 0–44)
AST: 14 U/L — ABNORMAL LOW (ref 15–41)
Albumin: 3.8 g/dL (ref 3.5–5.0)
Alkaline Phosphatase: 65 U/L (ref 38–126)
Anion gap: 5 (ref 5–15)
BUN: 41 mg/dL — ABNORMAL HIGH (ref 8–23)
CO2: 24 mmol/L (ref 22–32)
Calcium: 8.8 mg/dL — ABNORMAL LOW (ref 8.9–10.3)
Chloride: 110 mmol/L (ref 98–111)
Creatinine, Ser: 3.22 mg/dL — ABNORMAL HIGH (ref 0.61–1.24)
GFR, Estimated: 19 mL/min — ABNORMAL LOW (ref >60)
Glucose, Bld: 117 mg/dL — ABNORMAL HIGH (ref 70–99)
Potassium: 4.5 mmol/L (ref 3.5–5.1)
Sodium: 139 mmol/L (ref 135–145)
Total Bilirubin: 1.2 mg/dL (ref 0.3–1.2)
Total Protein: 7.2 g/dL (ref 6.5–8.1)

## 2022-05-21 LAB — URINALYSIS, ROUTINE W REFLEX MICROSCOPIC
Bilirubin Urine: NEGATIVE
Glucose, UA: NEGATIVE mg/dL
Ketones, ur: NEGATIVE mg/dL
Leukocytes,Ua: NEGATIVE
Nitrite: NEGATIVE
Protein, ur: 30 mg/dL — AB
Specific Gravity, Urine: 1.02 (ref 1.005–1.030)
pH: 5.5 (ref 5.0–8.0)

## 2022-05-21 LAB — CBC WITH DIFFERENTIAL/PLATELET
Abs Immature Granulocytes: 0.05 10*3/uL (ref 0.00–0.07)
Basophils Absolute: 0 10*3/uL (ref 0.0–0.1)
Basophils Relative: 1 %
Eosinophils Absolute: 0.2 10*3/uL (ref 0.0–0.5)
Eosinophils Relative: 2 %
HCT: 47.5 % (ref 39.0–52.0)
Hemoglobin: 15.8 g/dL (ref 13.0–17.0)
Immature Granulocytes: 1 %
Lymphocytes Relative: 17 %
Lymphs Abs: 1.3 10*3/uL (ref 0.7–4.0)
MCH: 30.8 pg (ref 26.0–34.0)
MCHC: 33.3 g/dL (ref 30.0–36.0)
MCV: 92.6 fL (ref 80.0–100.0)
Monocytes Absolute: 0.7 10*3/uL (ref 0.1–1.0)
Monocytes Relative: 10 %
Neutro Abs: 5.4 10*3/uL (ref 1.7–7.7)
Neutrophils Relative %: 69 %
Platelets: 131 10*3/uL — ABNORMAL LOW (ref 150–400)
RBC: 5.13 MIL/uL (ref 4.22–5.81)
RDW: 14.3 % (ref 11.5–15.5)
WBC: 7.6 10*3/uL (ref 4.0–10.5)
nRBC: 0 % (ref 0.0–0.2)

## 2022-05-21 LAB — LIPASE, BLOOD: Lipase: 48 U/L (ref 11–51)

## 2022-05-21 LAB — URINALYSIS, MICROSCOPIC (REFLEX)

## 2022-05-21 SURGERY — CYSTOSCOPY/URETEROSCOPY/HOLMIUM LASER/STENT PLACEMENT
Anesthesia: General | Laterality: Right

## 2022-05-21 MED ORDER — LISINOPRIL 10 MG PO TABS
10.0000 mg | ORAL_TABLET | Freq: Every day | ORAL | Status: DC
  Administered 2022-05-21: 10 mg via ORAL
  Filled 2022-05-21: qty 1

## 2022-05-21 MED ORDER — LORATADINE 10 MG PO TABS: 10.0000 mg | ORAL_TABLET | Freq: Every day | ORAL | Status: DC | PRN

## 2022-05-21 MED ORDER — SODIUM CHLORIDE 0.9 % IR SOLN
Status: DC | PRN
  Administered 2022-05-21: 3000 mL

## 2022-05-21 MED ORDER — ONDANSETRON HCL 4 MG PO TABS: 4.0000 mg | ORAL_TABLET | Freq: Four times a day (QID) | ORAL | Status: DC | PRN

## 2022-05-21 MED ORDER — SODIUM CHLORIDE 0.9 % IV SOLN: INTRAVENOUS | Status: DC

## 2022-05-21 MED ORDER — SODIUM CHLORIDE 0.9 % IV SOLN: 250.0000 mL | INTRAVENOUS | Status: DC | PRN

## 2022-05-21 MED ORDER — FENTANYL CITRATE PF 50 MCG/ML IJ SOSY: 25.0000 ug | PREFILLED_SYRINGE | INTRAMUSCULAR | Status: DC | PRN

## 2022-05-21 MED ORDER — ONDANSETRON HCL 4 MG/2ML IJ SOLN
INTRAMUSCULAR | Status: AC
  Filled 2022-05-21: qty 2

## 2022-05-21 MED ORDER — ALLOPURINOL 300 MG PO TABS
300.0000 mg | ORAL_TABLET | Freq: Every day | ORAL | Status: DC
  Administered 2022-05-21: 300 mg via ORAL
  Filled 2022-05-21: qty 1

## 2022-05-21 MED ORDER — LIDOCAINE 2% (20 MG/ML) 5 ML SYRINGE
INTRAMUSCULAR | Status: DC | PRN
  Administered 2022-05-21: 60 mg via INTRAVENOUS

## 2022-05-21 MED ORDER — PHENYLEPHRINE 80 MCG/ML (10ML) SYRINGE FOR IV PUSH (FOR BLOOD PRESSURE SUPPORT)
PREFILLED_SYRINGE | INTRAVENOUS | Status: AC
  Filled 2022-05-21: qty 10

## 2022-05-21 MED ORDER — CARVEDILOL 3.125 MG PO TABS
3.1250 mg | ORAL_TABLET | Freq: Two times a day (BID) | ORAL | Status: DC
  Administered 2022-05-21 – 2022-05-22 (×2): 3.125 mg via ORAL
  Filled 2022-05-21 (×2): qty 1

## 2022-05-21 MED ORDER — FENTANYL CITRATE (PF) 100 MCG/2ML IJ SOLN
INTRAMUSCULAR | Status: AC
  Filled 2022-05-21: qty 2

## 2022-05-21 MED ORDER — ONDANSETRON HCL 4 MG PO TABS: 4.0000 mg | ORAL_TABLET | Freq: Four times a day (QID) | ORAL | Status: DC

## 2022-05-21 MED ORDER — PROPOFOL 10 MG/ML IV BOLUS
INTRAVENOUS | Status: AC
  Filled 2022-05-21: qty 20

## 2022-05-21 MED ORDER — DEXAMETHASONE SODIUM PHOSPHATE 10 MG/ML IJ SOLN
INTRAMUSCULAR | Status: AC
  Filled 2022-05-21: qty 1

## 2022-05-21 MED ORDER — FLUTICASONE PROPIONATE 50 MCG/ACT NA SUSP: 1.0000 | Freq: Every day | NASAL | Status: DC | PRN

## 2022-05-21 MED ORDER — ACETAMINOPHEN 500 MG PO TABS: 500.0000 mg | ORAL_TABLET | Freq: Two times a day (BID) | ORAL | Status: DC | PRN

## 2022-05-21 MED ORDER — ONDANSETRON HCL 4 MG/2ML IJ SOLN
4.0000 mg | Freq: Once | INTRAMUSCULAR | Status: AC
  Administered 2022-05-21: 4 mg via INTRAVENOUS
  Filled 2022-05-21: qty 2

## 2022-05-21 MED ORDER — ONDANSETRON HCL 4 MG/2ML IJ SOLN
INTRAMUSCULAR | Status: AC
Start: 2022-05-21 — End: ?
  Filled 2022-05-21: qty 2

## 2022-05-21 MED ORDER — FENTANYL CITRATE (PF) 100 MCG/2ML IJ SOLN
INTRAMUSCULAR | Status: DC | PRN
Start: 2022-05-21 — End: 2022-05-21
  Administered 2022-05-21: 100 ug via INTRAVENOUS

## 2022-05-21 MED ORDER — FENTANYL CITRATE PF 50 MCG/ML IJ SOSY
50.0000 ug | PREFILLED_SYRINGE | Freq: Once | INTRAMUSCULAR | Status: AC
  Administered 2022-05-21: 50 ug via INTRAVENOUS
  Filled 2022-05-21: qty 1

## 2022-05-21 MED ORDER — PHENYLEPHRINE HCL (PRESSORS) 10 MG/ML IV SOLN
INTRAVENOUS | Status: DC | PRN
  Administered 2022-05-21: 160 ug via INTRAVENOUS

## 2022-05-21 MED ORDER — IOHEXOL 300 MG/ML  SOLN
INTRAMUSCULAR | Status: DC | PRN
  Administered 2022-05-21: 3 mL

## 2022-05-21 MED ORDER — PROPOFOL 10 MG/ML IV BOLUS
INTRAVENOUS | Status: DC | PRN
  Administered 2022-05-21: 120 mg via INTRAVENOUS

## 2022-05-21 MED ORDER — SODIUM CHLORIDE 0.9% FLUSH
3.0000 mL | INTRAVENOUS | Status: DC | PRN
  Administered 2022-05-21: 3 mL via INTRAVENOUS

## 2022-05-21 MED ORDER — ACETAMINOPHEN 160 MG/5ML PO SOLN: 1000.0000 mg | Freq: Once | ORAL | Status: DC | PRN

## 2022-05-21 MED ORDER — DEXAMETHASONE SODIUM PHOSPHATE 10 MG/ML IJ SOLN
INTRAMUSCULAR | Status: DC | PRN
  Administered 2022-05-21: 4 mg via INTRAVENOUS

## 2022-05-21 MED ORDER — LACTATED RINGERS IV SOLN: INTRAVENOUS | Status: DC

## 2022-05-21 MED ORDER — SODIUM CHLORIDE 0.9 % IV BOLUS
1000.0000 mL | Freq: Once | INTRAVENOUS | Status: AC
  Administered 2022-05-21: 1000 mL via INTRAVENOUS

## 2022-05-21 MED ORDER — CEFAZOLIN SODIUM-DEXTROSE 2-4 GM/100ML-% IV SOLN
2.0000 g | INTRAVENOUS | Status: AC
  Administered 2022-05-21: 2 g via INTRAVENOUS
  Filled 2022-05-21: qty 100

## 2022-05-21 MED ORDER — ACETAMINOPHEN 500 MG PO TABS: 1000.0000 mg | ORAL_TABLET | Freq: Once | ORAL | Status: DC | PRN

## 2022-05-21 MED ORDER — ACETAMINOPHEN 10 MG/ML IV SOLN: 1000.0000 mg | Freq: Once | INTRAVENOUS | Status: DC | PRN

## 2022-05-21 MED ORDER — OXYCODONE-ACETAMINOPHEN 5-325 MG PO TABS: 1.0000 | ORAL_TABLET | Freq: Every day | ORAL | Status: DC | PRN

## 2022-05-21 MED ORDER — SODIUM CHLORIDE 0.9% FLUSH
3.0000 mL | Freq: Two times a day (BID) | INTRAVENOUS | Status: DC
Start: 2022-05-21 — End: 2022-05-22
  Administered 2022-05-21: 3 mL via INTRAVENOUS

## 2022-05-21 MED ORDER — ONDANSETRON HCL 4 MG/2ML IJ SOLN
INTRAMUSCULAR | Status: DC | PRN
  Administered 2022-05-21: 4 mg via INTRAVENOUS

## 2022-05-21 SURGICAL SUPPLY — 25 items
BAG COUNTER SPONGE SURGICOUNT (BAG) ×1 IMPLANT
BAG SPNG CNTER NS LX DISP (BAG) ×1
BAG URO CATCHER STRL LF (MISCELLANEOUS) ×2 IMPLANT
BASKET ZERO TIP NITINOL 2.4FR (BASKET) ×1 IMPLANT
BSKT STON RTRVL ZERO TP 2.4FR (BASKET) ×1
CATH URETL OPEN END 6FR 70 (CATHETERS) ×1 IMPLANT
CLOTH BEACON ORANGE TIMEOUT ST (SAFETY) ×2 IMPLANT
GLOVE SURG LX 7.5 STRW (GLOVE) ×1
GLOVE SURG LX STRL 7.5 STRW (GLOVE) ×1 IMPLANT
GOWN STRL REUS W/ TWL XL LVL3 (GOWN DISPOSABLE) ×1 IMPLANT
GOWN STRL REUS W/TWL XL LVL3 (GOWN DISPOSABLE) ×2
GUIDEWIRE STR DUAL SENSOR (WIRE) ×3 IMPLANT
GUIDEWIRE ZIPWRE .038 STRAIGHT (WIRE) IMPLANT
IV NS 1000ML (IV SOLUTION) ×2
IV NS 1000ML BAXH (IV SOLUTION) ×1 IMPLANT
KIT TURNOVER KIT A (KITS) ×1 IMPLANT
LASER FIB FLEXIVA PULSE ID 365 (Laser) IMPLANT
MANIFOLD NEPTUNE II (INSTRUMENTS) ×2 IMPLANT
PACK CYSTO (CUSTOM PROCEDURE TRAY) ×2 IMPLANT
SHEATH NAVIGATOR HD 11/13X36 (SHEATH) IMPLANT
STENT URET 6FRX26 CONTOUR (STENTS) ×1 IMPLANT
TRACTIP FLEXIVA PULS ID 200XHI (Laser) IMPLANT
TRACTIP FLEXIVA PULSE ID 200 (Laser)
TUBING CONNECTING 10 (TUBING) ×2 IMPLANT
TUBING UROLOGY SET (TUBING) ×2 IMPLANT

## 2022-05-21 NOTE — Anesthesia Preprocedure Evaluation (Addendum)
Anesthesia Evaluation  Patient identified by MRN, date of birth, ID band Patient awake    Reviewed: Allergy & Precautions, NPO status , Patient's Chart, lab work & pertinent test results  History of Anesthesia Complications Negative for: history of anesthetic complications  Airway Mallampati: I  TM Distance: >3 FB Neck ROM: Full    Dental  (+) Dental Advisory Given, Upper Dentures, Partial Lower,    Pulmonary neg pulmonary ROS,    breath sounds clear to auscultation       Cardiovascular hypertension, Pt. on medications and Pt. on home beta blockers (-) angina(-) Past MI + pacemaker  Rhythm:Regular  1. Left ventricular ejection fraction, by estimation, is 40 to 45%. The  left ventricle has mildly decreased function. The left ventricle  demonstrates global hypokinesis. The left ventricular internal cavity size  was mildly dilated. Left ventricular  diastolic parameters are indeterminate.  2. Right ventricular systolic function is normal. The right ventricular  size is normal. Tricuspid regurgitation signal is inadequate for assessing  PA pressure.  3. Left atrial size was mildly dilated.  4. The mitral valve is normal in structure. Mild mitral valve  regurgitation. No evidence of mitral stenosis.  5. The aortic valve is tricuspid. Aortic valve regurgitation is trivial.  Mild aortic valve sclerosis is present, with no evidence of aortic valve  stenosis.  6. Aortic dilatation noted. There is borderline dilatation of the aortic  root, measuring 38 mm. There is mild dilatation of the ascending aorta,  measuring 42 mm.  7. The inferior vena cava is normal in size with greater than 50%  respiratory variability, suggesting right atrial pressure of 3 mmHg.    Neuro/Psych negative neurological ROS  negative psych ROS   GI/Hepatic negative GI ROS, Neg liver ROS,   Endo/Other  negative endocrine ROS  Renal/GU ARF and CRFRenal  diseaseLab Results      Component                Value               Date                      CREATININE               3.22 (H)            05/21/2022                Musculoskeletal negative musculoskeletal ROS (+)   Abdominal   Peds  Hematology negative hematology ROS (+) Lab Results      Component                Value               Date                      WBC                      7.6                 05/21/2022                HGB                      15.8                05/21/2022  HCT                      47.5                05/21/2022                MCV                      92.6                05/21/2022                PLT                      131 (L)             05/21/2022              Anesthesia Other Findings   Reproductive/Obstetrics                            Anesthesia Physical Anesthesia Plan  ASA: 3  Anesthesia Plan: General   Post-op Pain Management: Ofirmev IV (intra-op)*   Induction: Intravenous  PONV Risk Score and Plan: 2 and Ondansetron and Dexamethasone  Airway Management Planned: LMA  Additional Equipment: None  Intra-op Plan:   Post-operative Plan: Extubation in OR  Informed Consent: I have reviewed the patients History and Physical, chart, labs and discussed the procedure including the risks, benefits and alternatives for the proposed anesthesia with the patient or authorized representative who has indicated his/her understanding and acceptance.     Dental advisory given  Plan Discussed with: CRNA  Anesthesia Plan Comments:         Anesthesia Quick Evaluation

## 2022-05-21 NOTE — Transfer of Care (Signed)
Immediate Anesthesia Transfer of Care Note  Patient: Larry Macdonald  Procedure(s) Performed: CYSTOSCOPY/URETEROSCOPY/HOLMIUM LASER/STENT PLACEMENT (Right)  Patient Location: PACU  Anesthesia Type:General  Level of Consciousness: awake, alert , oriented and patient cooperative  Airway & Oxygen Therapy: Patient Spontanous Breathing  Post-op Assessment: Report given to RN, Post -op Vital signs reviewed and stable and Patient moving all extremities X 4  Post vital signs: Reviewed and stable  Last Vitals:  Vitals Value Taken Time  BP 135/79 05/21/22 1802  Temp    Pulse 63 05/21/22 1804  Resp 14 05/21/22 1804  SpO2 94 % 05/21/22 1804  Vitals shown include unvalidated device data.  Last Pain:  Vitals:   05/21/22 1615  TempSrc: Oral  PainSc: 0-No pain         Complications: No notable events documented.

## 2022-05-21 NOTE — Consult Note (Signed)
Urology Consult   Physician requesting consult: Dr. Gwyneth Sprout  Reason for consult: Ureteral calculus and AKI  History of Present Illness: Larry Macdonald is a 81 y.o. who presented to the [facility] ER earlier today with right flank pain.  CT imaging revealed a 5 mm distal right ureteral stone.  His Cr was elevated at 3.2 from a baseline of 1.57.  He was transferred to [facility] ED for further evaluation.  He denies a history of voiding or storage urinary symptoms, hematuria, UTIs, STDs, urolithiasis, GU malignancy/trauma/surgery.  Past Medical History:  Diagnosis Date   Complete heart block (HCC)    s/p PPM ([facility])   Hypertension   CKD Gout  LVEF of 40-45%  Past Surgical History:  Procedure Laterality Date   PACEMAKER IMPLANT N/A 04/02/2021   Procedure: PACEMAKER IMPLANT;  Surgeon: Hillis Range, MD;  Location: [facility] INVASIVE CV LAB;  Service: Cardiovascular;  Laterality: N/A;    Current Hospital Medications:  Home Meds:  No current facility-administered medications on file prior to encounter.   Current Outpatient Medications on File Prior to Encounter  Medication Sig Dispense Refill   allopurinol (ZYLOPRIM) 300 MG tablet Take 300 mg by mouth at bedtime.     Calcium Carbonate Antacid (TUMS PO) Take 2 tablets by mouth as needed (heartburn).     carvedilol (COREG) 3.125 MG tablet TAKE 1 TABLET BY MOUTH  TWICE DAILY (Patient taking differently: Take 3.125 mg by mouth 2 (two) times daily with a meal.) 180 tablet 0   cetirizine (ZYRTEC) 10 MG tablet Take 10 mg by mouth daily as needed for allergies.     Cholecalciferol (VITAMIN D3) 50 MCG (2000 UT) TABS Take 2,000 Units by mouth at bedtime.     fluticasone (FLONASE) 50 MCG/ACT nasal spray Place 1 spray into both nostrils daily as needed for allergies.     ibuprofen (ADVIL) 600 MG tablet Take 600 mg by mouth every 6 (six) hours as needed (pain).     lisinopril (ZESTRIL) 10 MG tablet Take 10 mg by mouth at bedtime.     ondansetron  (ZOFRAN) 4 MG tablet Take 4 mg by mouth every 6 (six) hours.     oxyCODONE-acetaminophen (PERCOCET/ROXICET) 5-325 MG tablet Take 1 tablet by mouth daily as needed (pain).     sildenafil (REVATIO) 20 MG tablet Take 100 mg by mouth as needed (sexual disfuction).     tamsulosin (FLOMAX) 0.4 MG CAPS capsule Take 0.4 mg by mouth daily.     acetaminophen (TYLENOL) 650 MG CR tablet Take 650-1,300 mg by mouth 2 (two) times daily as needed for pain. (Patient not taking: Reported on 05/21/2022)       Scheduled Meds: Continuous Infusions:  sodium chloride 125 mL/hr at 05/21/22 1448   PRN Meds:.  Allergies: No Known Allergies  Family History  Problem Relation Age of Onset   Lung cancer Father    Heart disease Neg Hx     Social History:  reports that he has never smoked. He does not have any smokeless tobacco history on file. He reports current alcohol use. He reports that he does not use drugs.  ROS: A complete review of systems was performed.  All systems are negative except for pertinent findings as noted.  Physical Exam:  Vital signs in last 24 hours: Temp:  [97.4 F (36.3 C)-98 F (36.7 C)] 97.4 F (36.3 C) (08/02 1424) Pulse Rate:  [58-67] 59 (08/02 1445) Resp:  [16-23] 17 (08/02 1445) BP: (145-186)/(61-98) 149/98 (08/02 1445)  SpO2:  [91 %-98 %] 96 % (08/02 1445) Weight:  [83.9 kg] 83.9 kg (08/02 1047) Constitutional:  Alert and oriented, No acute distress Cardiovascular: Regular rate and rhythm, No JVD Respiratory: Normal respiratory effort, Lungs clear bilaterally GI: Abdomen is soft, nontender, nondistended, no abdominal masses GU: No CVA tenderness Lymphatic: No lymphadenopathy Neurologic: Grossly intact, no focal deficits Psychiatric: Normal mood and affect  Laboratory Data:  Recent Labs    05/21/22 1112  WBC 7.6  HGB 15.8  HCT 47.5  PLT 131*    Recent Labs    05/21/22 1112  NA 139  K 4.5  CL 110  GLUCOSE 117*  BUN 41*  CALCIUM 8.8*  CREATININE 3.22*      Results for orders placed or performed during the hospital encounter of 05/21/22 (from the past 24 hour(s))  Comprehensive metabolic panel     Status: Abnormal   Collection Time: 05/21/22 11:12 AM  Result Value Ref Range   Sodium 139 135 - 145 mmol/L   Potassium 4.5 3.5 - 5.1 mmol/L   Chloride 110 98 - 111 mmol/L   CO2 24 22 - 32 mmol/L   Glucose, Bld 117 (H) 70 - 99 mg/dL   BUN 41 (H) 8 - 23 mg/dL   Creatinine, Ser 3.22 (H) 0.61 - 1.24 mg/dL   Calcium 8.8 (L) 8.9 - 10.3 mg/dL   Total Protein 7.2 6.5 - 8.1 g/dL   Albumin 3.8 3.5 - 5.0 g/dL   AST 14 (L) 15 - 41 U/L   ALT 12 0 - 44 U/L   Alkaline Phosphatase 65 38 - 126 U/L   Total Bilirubin 1.2 0.3 - 1.2 mg/dL   GFR, Estimated 19 (L) >60 mL/min   Anion gap 5 5 - 15  Lipase, blood     Status: None   Collection Time: 05/21/22 11:12 AM  Result Value Ref Range   Lipase 48 11 - 51 U/L  CBC with Diff     Status: Abnormal   Collection Time: 05/21/22 11:12 AM  Result Value Ref Range   WBC 7.6 4.0 - 10.5 K/uL   RBC 5.13 4.22 - 5.81 MIL/uL   Hemoglobin 15.8 13.0 - 17.0 g/dL   HCT 47.5 39.0 - 52.0 %   MCV 92.6 80.0 - 100.0 fL   MCH 30.8 26.0 - 34.0 pg   MCHC 33.3 30.0 - 36.0 g/dL   RDW 14.3 11.5 - 15.5 %   Platelets 131 (L) 150 - 400 K/uL   nRBC 0.0 0.0 - 0.2 %   Neutrophils Relative % 69 %   Neutro Abs 5.4 1.7 - 7.7 K/uL   Lymphocytes Relative 17 %   Lymphs Abs 1.3 0.7 - 4.0 K/uL   Monocytes Relative 10 %   Monocytes Absolute 0.7 0.1 - 1.0 K/uL   Eosinophils Relative 2 %   Eosinophils Absolute 0.2 0.0 - 0.5 K/uL   Basophils Relative 1 %   Basophils Absolute 0.0 0.0 - 0.1 K/uL   Immature Granulocytes 1 %   Abs Immature Granulocytes 0.05 0.00 - 0.07 K/uL  Urinalysis, Routine w reflex microscopic Urine, Clean Catch     Status: Abnormal   Collection Time: 05/21/22 11:12 AM  Result Value Ref Range   Color, Urine YELLOW YELLOW   APPearance CLEAR CLEAR   Specific Gravity, Urine 1.020 1.005 - 1.030   pH 5.5 5.0 - 8.0    Glucose, UA NEGATIVE NEGATIVE mg/dL   Hgb urine dipstick MODERATE (A) NEGATIVE   Bilirubin Urine  NEGATIVE NEGATIVE   Ketones, ur NEGATIVE NEGATIVE mg/dL   Protein, ur 30 (A) NEGATIVE mg/dL   Nitrite NEGATIVE NEGATIVE   Leukocytes,Ua NEGATIVE NEGATIVE  Urinalysis, Microscopic (reflex)     Status: Abnormal   Collection Time: 05/21/22 11:12 AM  Result Value Ref Range   RBC / HPF 21-50 0 - 5 RBC/hpf   WBC, UA 0-5 0 - 5 WBC/hpf   Bacteria, UA RARE (A) NONE SEEN   Squamous Epithelial / LPF 0-5 0 - 5   No results found for this or any previous visit (from the past 240 hour(s)).  Renal Function: Recent Labs    05/21/22 1112  CREATININE 3.22*   Estimated Creatinine Clearance: 19.5 mL/min (A) (by C-G formula based on SCr of 3.22 mg/dL (H)).  Radiologic Imaging: CT Renal Stone Study  Result Date: 05/21/2022 CLINICAL DATA:  Right flank pain. EXAM: CT ABDOMEN AND PELVIS WITHOUT CONTRAST TECHNIQUE: Multidetector CT imaging of the abdomen and pelvis was performed following the standard protocol without IV contrast. RADIATION DOSE REDUCTION: This exam was performed according to the departmental dose-optimization program which includes automated exposure control, adjustment of the mA and/or kV according to patient size and/or use of iterative reconstruction technique. COMPARISON:  None Available. FINDINGS: Lower chest: No acute abnormality. Hepatobiliary: No focal liver abnormality is seen. No gallstones, gallbladder wall thickening, or biliary dilatation. Pancreas: Course focal pancreatic calcifications. No pancreatic ductal dilatation or surrounding inflammatory changes. Spleen: Normal in size without focal abnormality. Adrenals/Urinary Tract: Adrenal glands are unremarkable. Moderate right hydroureteronephrosis secondary to a 4.5 mm calculus at the ureterovesical junction. Moderate right perinephric fat stranding. No left nephrolithiasis or hydronephrosis. No left ureteral calculus. Urinary bladder is  unremarkable. Stomach/Bowel: Stomach is within normal limits. Appendix appears normal. Colonic diverticulosis without evidence of acute diverticulitis. Vascular/Lymphatic: Moderate aortic atherosclerosis. No enlarged abdominal or pelvic lymph nodes. Reproductive: Prostate is enlarged measuring up to 6 cm in transverse dimension. Other: No abdominal wall hernia or abnormality. No abdominopelvic ascites. 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 No acute osseous abnormality. IMPRESSION: 1. Moderate right hydroureteronephrosis secondary to a 4 x 5 mm calculus at the ureterovesical junction. 2.  Left kidney, left ureter and urinary bladder are unremarkable. 3.  Enlarged prostate. 4. Bowel loops are normal in caliber. Normal appendix. Colonic diverticulosis without evidence of acute diverticulitis. 5. 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 No acute osseous abnormality. Electronically Signed   By: Larose Hires D.O.   On: 05/21/2022 12:07    I independently reviewed the above imaging studies.  Impression/Recommendation 1) Right ureteral calculus with AKI:  Will proceed with intervention with cystoscopy, right ureteroscopic laser lithotripsy and stone removal and right ureteral stent placement.  I discussed the potential benefits and risks of the procedure, side effects of the proposed treatment, the likelihood of the patient achieving the goals of the procedure, and any potential problems that might occur during the procedure or recuperation. Will then plan to admit for observation to ensure improvement of his renal function s/p treatment and relief of his ureteral obstruction.  Crecencio Mc 05/21/2022, 3:07 PM    Moody Bruins MD   CC:

## 2022-05-21 NOTE — Op Note (Signed)
Preoperative diagnosis: Right ureteral calculus, acute kidney injury  Postoperative diagnosis: Right ureteral calculus, acute kidney injury  Procedure:  Cystoscopy Right ureteroscopy and stone removal Right ureteral stent placement (6 x 26 - no string)  Right retrograde pyelography with interpretation  Surgeon: Moody Bruins. M.D.  Anesthesia: General  Complications: None  Intraoperative findings: Right retrograde pyelography demonstrated a filling defect within the distal right ureter consistent with the patient's known calculus without other abnormalities.  EBL: Minimal  Specimens: Right ureteral calculi  Disposition of specimens: [facility] for stone analysis  Indication: Larry Macdonald is a 81 y.o. year old patient with urolithiasis.  He was found to have a distal right ureteral calculus or possibly multiple calculi measuring approximately 5 mm in total diameter.  He was also noted to have an acute kidney injury with a serum creatinine of 3.2 up from a baseline of 1.5.  After reviewing the management options for treatment, the patient elected to proceed with the above surgical procedure(s). We have discussed the potential benefits and risks of the procedure, side effects of the proposed treatment, the likelihood of the patient achieving the goals of the procedure, and any potential problems that might occur during the procedure or recuperation. Informed consent has been obtained.  Description of procedure:  The patient was taken to the operating room and general anesthesia was induced.  The patient was placed in the dorsal lithotomy position, prepped and draped in the usual sterile fashion, and preoperative antibiotics were administered. A preoperative time-out was performed.   Cystourethroscopy was performed.  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 The bladder was then systematically  examined in its entirety. There was no evidence for any bladder tumors, stones, or other mucosal pathology.    Attention then turned to the right ureteral orifice and a ureteral catheter was used to intubate the ureteral orifice.  Omnipaque contrast was injected through the ureteral catheter and a retrograde pyelogram was performed with findings as dictated above.  A 0.38 sensor guidewire was then advanced up the right ureter into the renal pelvis under fluoroscopic guidance. The 6 Fr semirigid ureteroscope was then advanced into the ureter next to the guidewire and the calculus was identified.  All stones were then removed from the ureter with a zero tip nitinol basket.  Reinspection of the ureter revealed no remaining visible stones or fragments.   The wire was then backloaded through the cystoscope and a ureteral stent was advance over the wire using Seldinger technique.  The stent was positioned appropriately under fluoroscopic and cystoscopic guidance.  The wire was then removed with an adequate stent curl noted in the renal pelvis as well as in the bladder.  The bladder was then emptied and the procedure ended.  The patient appeared to tolerate the procedure well and without complications.  The patient was able to be awakened and transferred to the recovery unit in satisfactory condition.

## 2022-05-21 NOTE — Anesthesia Postprocedure Evaluation (Signed)
Anesthesia Post Note  Patient: Larry Macdonald  Procedure(s) Performed: CYSTOSCOPY/URETEROSCOPY/HOLMIUM LASER/STENT PLACEMENT (Right)     Anesthesia Type: General Anesthetic complications: no   No notable events documented.  Last Vitals:  Vitals:   05/21/22 1830 05/21/22 1902  BP: (!) 160/91 (!) 171/85  Pulse: 64 62  Resp: 16 18  Temp:  36.6 C  SpO2: 94% 94%    Last Pain:  Vitals:   05/21/22 1902  TempSrc: Oral  PainSc: 0-No pain                 Phung Kotas

## 2022-05-21 NOTE — ED Triage Notes (Signed)
Dx with kidney stone on 7/29. Continues to have pain. Taking flomax. Hasnt followed up with urology. Hasnt taken prescribed pain meds.

## 2022-05-21 NOTE — ED Triage Notes (Addendum)
[facility] transferred [facility] for kidney stone.   4x5 stone   Pt got 1 liter of fluid  50 mcg fentanyl and 4mg  zofran at 1115 at med center  Pain is 2/10 at this time  BP-160s  20g L AC  Needs VS

## 2022-05-21 NOTE — ED Notes (Signed)
[facility] arrived at this time to transport Patient. Patient ambulated to gurney without assistance of or difficulty. No acute distress noted upon this RN's departure of Patient.

## 2022-05-21 NOTE — Anesthesia Procedure Notes (Signed)
Procedure Name: LMA Insertion Date/Time: 05/21/2022 5:31 PM  Performed by: Shanon Payor, CRNAPre-anesthesia Checklist: Patient identified, Emergency Drugs available, Suction available, Patient being monitored and Timeout performed Patient Re-evaluated:Patient Re-evaluated prior to induction Oxygen Delivery Method: Circle system utilized Preoxygenation: Pre-oxygenation with 100% oxygen Induction Type: IV induction LMA: LMA inserted LMA Size: 4.0 Number of attempts: 1 Placement Confirmation: ETT inserted through vocal cords under direct vision, positive ETCO2 and breath sounds checked- equal and bilateral Tube secured with: Tape Dental Injury: Teeth and Oropharynx as per pre-operative assessment

## 2022-05-21 NOTE — ED Provider Notes (Signed)
[facility] EMERGENCY DEPARTMENT Provider Note   CSN: [id number] Arrival date & time: 05/21/22  1041     History  Chief Complaint  Patient presents with   Flank Pain    Larry Macdonald is a 81 y.o. male.   Flank Pain    81 year old male with recent diagnosis of nephrolithiasis, history of CHB status post PPM, HTN who presents to the emergency department with flank pain.  The patient states that he was diagnosed with a kidney stone on 7/29.  He states that his pain resolved so he stopped taking his pain medication.  He has been taking Flomax.  As of this morning, his pain recurred.  He endorses right-sided sharp flank pain with associated mild nausea.  He denies any fevers or chills.  He denies any dysuria or increased urinary frequency.  Home Medications Prior to Admission medications   Medication Sig Start Date End Date Taking? Authorizing Provider  allopurinol (ZYLOPRIM) 300 MG tablet Take 300 mg by mouth at bedtime.   Yes [provider]  Calcium Carbonate Antacid (TUMS PO) Take 2 tablets by mouth as needed (heartburn).   Yes [provider]  carvedilol (COREG) 3.125 MG tablet TAKE 1 TABLET BY MOUTH  TWICE DAILY Patient taking differently: Take 3.125 mg by mouth 2 (two) times daily with a meal. 04/23/22  Yes Allred, Fayrene Fearing, MD  cetirizine (ZYRTEC) 10 MG tablet Take 10 mg by mouth daily as needed for allergies.   Yes [provider]  Cholecalciferol (VITAMIN D3) 50 MCG (2000 UT) TABS Take 2,000 Units by mouth at bedtime.   Yes [provider]  fluticasone (FLONASE) 50 MCG/ACT nasal spray Place 1 spray into both nostrils daily as needed for allergies. 07/31/21 07/31/22 Yes [provider]  ibuprofen (ADVIL) 600 MG tablet Take 600 mg by mouth every 6 (six) hours as needed (pain). 05/17/22  Yes [provider]  lisinopril (ZESTRIL) 10 MG tablet Take 10 mg by mouth at bedtime.   Yes [provider]  ondansetron  (ZOFRAN) 4 MG tablet Take 4 mg by mouth every 6 (six) hours. 05/17/22  Yes [provider]  oxyCODONE-acetaminophen (PERCOCET/ROXICET) 5-325 MG tablet Take 1 tablet by mouth daily as needed (pain). 05/17/22  Yes [provider]  sildenafil (REVATIO) 20 MG tablet Take 100 mg by mouth as needed (sexual disfuction). 02/11/22  Yes [provider]  tamsulosin (FLOMAX) 0.4 MG CAPS capsule Take 0.4 mg by mouth daily. 05/17/22  Yes [provider]  acetaminophen (TYLENOL) 650 MG CR tablet Take 650-1,300 mg by mouth 2 (two) times daily as needed for pain. Patient not taking: Reported on 05/21/2022    [provider]      Allergies    Patient has no known allergies.    Review of Systems   Review of Systems  Genitourinary:  Positive for flank pain.    Physical Exam Updated Vital Signs BP (!) 171/85 (BP Location: Right Arm)   Pulse 62   Temp 97.8 F (36.6 C) (Oral)   Resp 18   Ht 5\' 11"  (1.803 m)   Wt 83.9 kg   SpO2 94%   BMI 25.80 kg/m  Physical Exam Vitals and nursing note reviewed.  Constitutional:      General: He is not in acute distress.    Appearance: He is well-developed.  HENT:     Head: Normocephalic and atraumatic.  Eyes:     Conjunctiva/sclera: Conjunctivae normal.  Cardiovascular:  Rate and Rhythm: Normal rate and regular rhythm.  Pulmonary:     Effort: Pulmonary effort is normal. No respiratory distress.     Breath sounds: Normal breath sounds.  Abdominal:     Palpations: Abdomen is soft.     Tenderness: There is no abdominal tenderness. There is right CVA tenderness. There is no left CVA tenderness, guarding or rebound.  Musculoskeletal:        General: No swelling.     Cervical back: Neck supple.  Skin:    General: Skin is warm and dry.     Capillary Refill: Capillary refill takes less than 2 seconds.  Neurological:     Mental Status: He is alert.  Psychiatric:        Mood and Affect: Mood normal.     ED Results /  Procedures / Treatments   Labs (all labs ordered are listed, but only abnormal results are displayed) Labs Reviewed  COMPREHENSIVE METABOLIC PANEL - Abnormal; Notable for the following components:      Result Value   Glucose, Bld 117 (*)    BUN 41 (*)    Creatinine, Ser 3.22 (*)    Calcium 8.8 (*)    AST 14 (*)    GFR, Estimated 19 (*)    All other components within normal limits  CBC WITH DIFFERENTIAL/PLATELET - Abnormal; Notable for the following components:   Platelets 131 (*)    All other components within normal limits  URINALYSIS, ROUTINE W REFLEX MICROSCOPIC - Abnormal; Notable for the following components:   Hgb urine dipstick MODERATE (*)    Protein, ur 30 (*)    All other components within normal limits  URINALYSIS, MICROSCOPIC (REFLEX) - Abnormal; Notable for the following components:   Bacteria, UA RARE (*)    All other components within normal limits  LIPASE, BLOOD  BASIC METABOLIC PANEL    EKG None  Radiology DG C-Arm 1-60 Min-No Report  Result Date: 05/21/2022 Fluoroscopy was utilized by the requesting physician.  No radiographic interpretation.   CT Renal Stone Study  Result Date: 05/21/2022 CLINICAL DATA:  Right flank pain. EXAM: CT ABDOMEN AND PELVIS WITHOUT CONTRAST TECHNIQUE: Multidetector CT imaging of the abdomen and pelvis was performed following the standard protocol without IV contrast. RADIATION DOSE REDUCTION: This exam was performed according to the departmental dose-optimization program which includes automated exposure control, adjustment of the mA and/or kV according to patient size and/or use of iterative reconstruction technique. COMPARISON:  None Available. FINDINGS: Lower chest: No acute abnormality. Hepatobiliary: No focal liver abnormality is seen. No gallstones, gallbladder wall thickening, or biliary dilatation. Pancreas: Course focal pancreatic calcifications. No pancreatic ductal dilatation or surrounding inflammatory changes. Spleen: Normal  in size without focal abnormality. Adrenals/Urinary Tract: Adrenal glands are unremarkable. Moderate right hydroureteronephrosis secondary to a 4.5 mm calculus at the ureterovesical junction. Moderate right perinephric fat stranding. No left nephrolithiasis or hydronephrosis. No left ureteral calculus. Urinary bladder is unremarkable. Stomach/Bowel: Stomach is within normal limits. Appendix appears normal. Colonic diverticulosis without evidence of acute diverticulitis. Vascular/Lymphatic: Moderate aortic atherosclerosis. No enlarged abdominal or pelvic lymph nodes. Reproductive: Prostate is enlarged measuring up to 6 cm in transverse dimension. Other: No abdominal wall hernia or abnormality. No abdominopelvic ascites. 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 No acute osseous abnormality. IMPRESSION: 1. Moderate right hydroureteronephrosis secondary to a 4 x 5 mm calculus at the ureterovesical junction. 2.  Left kidney, left ureter and urinary bladder are unremarkable. 3.  Enlarged prostate.  4. Bowel loops are normal in caliber. Normal appendix. Colonic diverticulosis without evidence of acute diverticulitis. 5. 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 No acute osseous abnormality. Electronically Signed   By: Larose Hires D.O.   On: 05/21/2022 12:07    Procedures Procedures    Medications Ordered in ED Medications  acetaminophen (TYLENOL) tablet 500-1,000 mg (has no administration in time range)  allopurinol (ZYLOPRIM) tablet 300 mg (has no administration in time range)  carvedilol (COREG) tablet 3.125 mg (has no administration in time range)  loratadine (CLARITIN) tablet 10 mg (has no administration in time range)  fluticasone (FLONASE) 50 MCG/ACT nasal spray 1 spray (has no administration in time range)  lisinopril (ZESTRIL) tablet 10 mg (has no administration in time range)  oxyCODONE-acetaminophen (PERCOCET/ROXICET) 5-325 MG per  tablet 1 tablet (has no administration in time range)  sodium chloride flush (NS) 0.9 % injection 3 mL (has no administration in time range)  sodium chloride flush (NS) 0.9 % injection 3 mL (has no administration in time range)  0.9 %  sodium chloride infusion (has no administration in time range)  ondansetron (ZOFRAN) tablet 4 mg (has no administration in time range)  sodium chloride 0.9 % bolus 1,000 mL ( Intravenous Stopped 05/21/22 1224)  fentaNYL (SUBLIMAZE) injection 50 mcg (50 mcg Intravenous Given 05/21/22 1115)  ondansetron (ZOFRAN) injection 4 mg (4 mg Intravenous Given 05/21/22 1114)  ceFAZolin (ANCEF) IVPB 2g/100 mL premix ( Intravenous MAR Unhold 05/21/22 1853)    ED Course/ Medical Decision Making/ A&P                           Medical Decision Making Amount and/or Complexity of Data Reviewed Labs: ordered. Radiology: ordered.  Risk Prescription drug management. Decision regarding hospitalization.     81 year old male with recent diagnosis of nephrolithiasis, history of CHB status post PPM, HTN who presents to the emergency department with flank pain.  The patient states that he was diagnosed with a kidney stone on 7/29.  He states that his pain resolved so he stopped taking his pain medication.  He has been taking Flomax.  As of this morning, his pain recurred.  He endorses right-sided sharp flank pain with associated mild nausea.  He denies any fevers or chills.  He denies any dysuria or increased urinary frequency.  On arrival, the patient vitals were stable for afebrile, hemodynamically stable, BP 186/96, saturating 98% on room air, sinus rhythm noted on cardiac telemetry.  Patient presenting with physical exam concerning for right-sided CVA tenderness, his abdomen was soft, nondistended, nontender on exam.  He states that his symptoms improved and have subsequently recurred.  He has been taking Flomax outpatient.  He been pain-free for at least a day or 2.  He now endorses  severe right-sided flank pain.  Differential diagnosis includes pyelonephritis, UTI, recurrent nephrolithiasis.  Urinalysis positive for hematuria, rare bacteria present, no clear UTI.  CMP was collected and resulted positive for AKI with a creatinine of 3.22.  His CBC was without leukocytosis or anemia and lipase was normal.  Patient was administered normal saline bolus and IV fentanyl and Zofran.  CT renal stone study revealed the following: IMPRESSION:  1. Moderate right hydroureteronephrosis secondary to a 4 x 5 mm  calculus at the ureterovesical junction.    2.  Left kidney, left ureter and urinary bladder are unremarkable.    3.  Enlarged prostate.    4. Bowel loops  are normal in caliber. Normal appendix. Colonic  diverticulosis without evidence of acute diverticulitis.    5. 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  No acute osseous abnormality.   Spoke with Dr. Laverle Patter who recommended transfer of the patient to [facility] for further monitoring.  Patient was subsequently transferred in stable condition.  Dr. Anitra Lauth accepted the patient in transfer.   Final Clinical Impression(s) / ED Diagnoses Final diagnoses:  Ureterolithiasis  AKI (acute kidney injury) [facility])    Rx / DC Orders ED Discharge Orders     None         Ernie Avena, MD 05/21/22 2123

## 2022-05-21 NOTE — Discharge Instructions (Signed)

## 2022-05-21 NOTE — ED Notes (Signed)
Consent signed. Pt transported to short stay

## 2022-05-22 ENCOUNTER — Encounter (HOSPITAL_COMMUNITY): Payer: Self-pay | Admitting: Urology

## 2022-05-22 ENCOUNTER — Other Ambulatory Visit: Payer: Self-pay

## 2022-05-22 LAB — BASIC METABOLIC PANEL
Anion gap: 7 (ref 5–15)
BUN: 32 mg/dL — ABNORMAL HIGH (ref 8–23)
CO2: 21 mmol/L — ABNORMAL LOW (ref 22–32)
Calcium: 8.7 mg/dL — ABNORMAL LOW (ref 8.9–10.3)
Chloride: 111 mmol/L (ref 98–111)
Creatinine, Ser: 1.93 mg/dL — ABNORMAL HIGH (ref 0.61–1.24)
GFR, Estimated: 35 mL/min — ABNORMAL LOW (ref >60)
Glucose, Bld: 144 mg/dL — ABNORMAL HIGH (ref 70–99)
Potassium: 5.3 mmol/L — ABNORMAL HIGH (ref 3.5–5.1)
Sodium: 139 mmol/L (ref 135–145)

## 2022-05-22 NOTE — Discharge Summary (Signed)
  Date of admission: 05/21/2022  Date of discharge: 05/22/2022  Admission diagnosis: Right ureteral calculi, AKI  Discharge diagnosis: Right ureteral calculi, AKI  Secondary diagnoses: Gout, Heart block  History and Physical: For full details, please see admission history and physical. Briefly, Larry Macdonald is a 81 y.o. year old patient with distal right ureteral calculi and AKI.   Hospital Course:  He underwent right ureteroscopic stone removal and right ureteral stent placement.  His renal function was improving the following morning and he was able to be discharged home.  Laboratory values:  Recent Labs    05/21/22 1112  HGB 15.8  HCT 47.5   Recent Labs    05/21/22 1112 05/22/22 0426  CREATININE 3.22* 1.93*    Disposition: Home  Discharge instruction: The patient was instructed that he may resume a normal diet and activity.  Discharge medications:  Allergies as of 05/22/2022   No Known Allergies      Medication List     STOP taking these medications    ibuprofen 600 MG tablet Commonly known as: ADVIL       TAKE these medications    acetaminophen 650 MG CR tablet Commonly known as: TYLENOL Take 650-1,300 mg by mouth 2 (two) times daily as needed for pain.   allopurinol 300 MG tablet Commonly known as: ZYLOPRIM Take 300 mg by mouth at bedtime.   carvedilol 3.125 MG tablet Commonly known as: COREG TAKE 1 TABLET BY MOUTH  TWICE DAILY What changed: when to take this   cetirizine 10 MG tablet Commonly known as: ZYRTEC Take 10 mg by mouth daily as needed for allergies.   fluticasone 50 MCG/ACT nasal spray Commonly known as: FLONASE Place 1 spray into both nostrils daily as needed for allergies.   lisinopril 10 MG tablet Commonly known as: ZESTRIL Take 10 mg by mouth at bedtime.   ondansetron 4 MG tablet Commonly known as: ZOFRAN Take 4 mg by mouth every 6 (six) hours.   oxyCODONE-acetaminophen 5-325 MG tablet Commonly known as:  PERCOCET/ROXICET Take 1 tablet by mouth daily as needed (pain).   sildenafil 20 MG tablet Commonly known as: REVATIO Take 100 mg by mouth as needed (sexual disfuction).   tamsulosin 0.4 MG Caps capsule Commonly known as: FLOMAX Take 0.4 mg by mouth daily.   TUMS PO Take 2 tablets by mouth as needed (heartburn).   Vitamin D3 50 MCG (2000 UT) Tabs Take 2,000 Units by mouth at bedtime.        Followup:   Follow-up Information     Heloise Purpura, MD Follow up.   Specialty: Urology Why: Will call to schedule follow up in about 2 weeks Contact information: [address] [place] [address] [phone]

## 2022-05-22 NOTE — Progress Notes (Signed)
Patient discharged home.  Discharge instructions explained, patient verbalizes understanding 

## 2022-05-22 NOTE — Progress Notes (Signed)
Patient ID: Larry Macdonald, male   DOB: 1941-01-08, 81 y.o.   MRN: [id number]  1 Day Post-Op Subjective: Pt doing well s/p stent.  Denies significant pain this morning.  Objective: Vital signs in last 24 hours: Temp:  [97.4 F (36.3 C)-98.3 F (36.8 C)] 97.9 F (36.6 C) (08/03 0539) Pulse Rate:  [58-67] 62 (08/03 0539) Resp:  [14-23] 18 (08/03 0539) BP: (132-186)/(61-98) 132/68 (08/03 0539) SpO2:  [91 %-98 %] 93 % (08/03 0539) Weight:  [83.9 kg] 83.9 kg (08/02 1615)  Intake/Output from previous day: 08/02 0701 - 08/03 0700 In: 1400.4 [I.V.:300; IV Piggyback:1100.4] Out: 35 [Urine:25; Blood:10] Intake/Output this shift: No intake/output data recorded.  Physical Exam:  General: Alert and oriented Abdomen: No CVAT  Lab Results: Recent Labs    05/21/22 1112  HGB 15.8  HCT 47.5   BMET Recent Labs    05/21/22 1112 05/22/22 0426  NA 139 139  K 4.5 5.3*  CL 110 111  CO2 24 21*  GLUCOSE 117* 144*  BUN 41* 32*  CREATININE 3.22* 1.93*  CALCIUM 8.8* 8.7*     Studies/Results: DG C-Arm 1-60 Min-No Report  Result Date: 05/21/2022 Fluoroscopy was utilized by the requesting physician.  No radiographic interpretation.   CT Renal Stone Study  Result Date: 05/21/2022 CLINICAL DATA:  Right flank pain. EXAM: CT ABDOMEN AND PELVIS WITHOUT CONTRAST TECHNIQUE: Multidetector CT imaging of the abdomen and pelvis was performed following the standard protocol without IV contrast. RADIATION DOSE REDUCTION: This exam was performed according to the departmental dose-optimization program which includes automated exposure control, adjustment of the mA and/or kV according to patient size and/or use of iterative reconstruction technique. COMPARISON:  None Available. FINDINGS: Lower chest: No acute abnormality. Hepatobiliary: No focal liver abnormality is seen. No gallstones, gallbladder wall thickening, or biliary dilatation. Pancreas: Course focal pancreatic calcifications. No pancreatic ductal  dilatation or surrounding inflammatory changes. Spleen: Normal in size without focal abnormality. Adrenals/Urinary Tract: Adrenal glands are unremarkable. Moderate right hydroureteronephrosis secondary to a 4.5 mm calculus at the ureterovesical junction. Moderate right perinephric fat stranding. No left nephrolithiasis or hydronephrosis. No left ureteral calculus. Urinary bladder is unremarkable. Stomach/Bowel: Stomach is within normal limits. Appendix appears normal. Colonic diverticulosis without evidence of acute diverticulitis. Vascular/Lymphatic: Moderate aortic atherosclerosis. No enlarged abdominal or pelvic lymph nodes. Reproductive: Prostate is enlarged measuring up to 6 cm in transverse dimension. Other: No abdominal wall hernia or abnormality. No abdominopelvic ascites. 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 No acute osseous abnormality. IMPRESSION: 1. Moderate right hydroureteronephrosis secondary to a 4 x 5 mm calculus at the ureterovesical junction. 2.  Left kidney, left ureter and urinary bladder are unremarkable. 3.  Enlarged prostate. 4. Bowel loops are normal in caliber. Normal appendix. Colonic diverticulosis without evidence of acute diverticulitis. 5. 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 No acute osseous abnormality. Electronically Signed   By: Larose Hires D.O.   On: 05/21/2022 12:07    Assessment/Plan: Renal function improved.  Will D/C home.   LOS: 0 days   Crecencio Mc 05/22/2022, 8:33 AM

## 2022-07-03 ENCOUNTER — Ambulatory Visit (INDEPENDENT_AMBULATORY_CARE_PROVIDER_SITE_OTHER): Payer: Medicare Other

## 2022-07-03 DIAGNOSIS — I442 Atrioventricular block, complete: Secondary | ICD-10-CM | POA: Diagnosis not present

## 2022-07-03 LAB — CUP PACEART REMOTE DEVICE CHECK
Battery Remaining Longevity: 87 mo
Battery Remaining Percentage: 88 %
Battery Voltage: 3.01 V
Brady Statistic AP VP Percent: 84 %
Brady Statistic AP VS Percent: 1 %
Brady Statistic AS VP Percent: 16 %
Brady Statistic AS VS Percent: 1 %
Brady Statistic RA Percent Paced: 84 %
Brady Statistic RV Percent Paced: 99 %
Date Time Interrogation Session: 20230914023939
Implantable Lead Implant Date: 20220614
Implantable Lead Implant Date: 20220614
Implantable Lead Location: 753859
Implantable Lead Location: 753860
Implantable Lead Model: 3830
Implantable Pulse Generator Implant Date: 20220614
Lead Channel Impedance Value: 390 Ohm
Lead Channel Impedance Value: 540 Ohm
Lead Channel Pacing Threshold Amplitude: 0.5 V
Lead Channel Pacing Threshold Amplitude: 0.75 V
Lead Channel Pacing Threshold Pulse Width: 0.5 ms
Lead Channel Pacing Threshold Pulse Width: 0.5 ms
Lead Channel Sensing Intrinsic Amplitude: 5 mV
Lead Channel Sensing Intrinsic Amplitude: 9.6 mV
Lead Channel Setting Pacing Amplitude: 2 V
Lead Channel Setting Pacing Amplitude: 2.5 V
Lead Channel Setting Pacing Pulse Width: 0.5 ms
Lead Channel Setting Sensing Sensitivity: 4 mV
Pulse Gen Model: 2272
Pulse Gen Serial Number: 3933533

## 2022-07-15 ENCOUNTER — Other Ambulatory Visit: Payer: Self-pay | Admitting: Internal Medicine

## 2022-07-16 NOTE — Progress Notes (Signed)
Remote pacemaker transmission.   

## 2022-07-23 ENCOUNTER — Other Ambulatory Visit: Payer: Self-pay | Admitting: Internal Medicine

## 2022-10-02 ENCOUNTER — Ambulatory Visit (INDEPENDENT_AMBULATORY_CARE_PROVIDER_SITE_OTHER): Payer: Medicare Other

## 2022-10-02 DIAGNOSIS — I442 Atrioventricular block, complete: Secondary | ICD-10-CM | POA: Diagnosis not present

## 2022-10-03 LAB — CUP PACEART REMOTE DEVICE CHECK
Battery Remaining Longevity: 85 mo
Battery Remaining Percentage: 85 %
Battery Voltage: 3.01 V
Brady Statistic AP VP Percent: 85 %
Brady Statistic AP VS Percent: 1 %
Brady Statistic AS VP Percent: 15 %
Brady Statistic AS VS Percent: 1 %
Brady Statistic RA Percent Paced: 85 %
Brady Statistic RV Percent Paced: 99 %
Date Time Interrogation Session: 20231215015014
Implantable Lead Connection Status: 753985
Implantable Lead Connection Status: 753985
Implantable Lead Implant Date: 20220614
Implantable Lead Implant Date: 20220614
Implantable Lead Location: 753859
Implantable Lead Location: 753860
Implantable Lead Model: 3830
Implantable Pulse Generator Implant Date: 20220614
Lead Channel Impedance Value: 390 Ohm
Lead Channel Impedance Value: 550 Ohm
Lead Channel Pacing Threshold Amplitude: 0.5 V
Lead Channel Pacing Threshold Amplitude: 0.75 V
Lead Channel Pacing Threshold Pulse Width: 0.5 ms
Lead Channel Pacing Threshold Pulse Width: 0.5 ms
Lead Channel Sensing Intrinsic Amplitude: 5 mV
Lead Channel Sensing Intrinsic Amplitude: 9.6 mV
Lead Channel Setting Pacing Amplitude: 2 V
Lead Channel Setting Pacing Amplitude: 2.5 V
Lead Channel Setting Pacing Pulse Width: 0.5 ms
Lead Channel Setting Sensing Sensitivity: 4 mV
Pulse Gen Model: 2272
Pulse Gen Serial Number: 3933533

## 2022-10-24 NOTE — Progress Notes (Signed)
Remote pacemaker transmission.   

## 2022-11-24 ENCOUNTER — Telehealth: Payer: Self-pay | Admitting: Student

## 2022-11-24 MED ORDER — CARVEDILOL 3.125 MG PO TABS: 3.1250 mg | ORAL_TABLET | Freq: Two times a day (BID) | ORAL | 0 refills | Status: DC

## 2022-11-24 NOTE — Telephone Encounter (Signed)
Pt's medication was sent to pt's pharmacy as requested. Confirmation received.  °

## 2022-11-24 NOTE — Telephone Encounter (Signed)
*  STAT* If patient is at the pharmacy, call can be transferred to refill team.   1. Which medications need to be refilled? (please list name of each medication and dose if known) carvedilol (COREG) 3.125 MG tablet  2. Which pharmacy/location (including street and city if local pharmacy) is medication to be sent to? [place], [place]   3. Do they need a 30 day or 90 day supply? [place]

## 2022-12-08 ENCOUNTER — Other Ambulatory Visit: Payer: Self-pay | Admitting: Student

## 2022-12-08 DIAGNOSIS — Z950 Presence of cardiac pacemaker: Secondary | ICD-10-CM | POA: Insufficient documentation

## 2022-12-08 NOTE — Progress Notes (Unsigned)
Cardiology Office Note Date:  12/09/2022  Patient ID:  Larry, Macdonald 11-09-1940, MRN [id number] PCP:  Elisabeth Cara, PA-C  Cardiologist:  None Electrophysiologist: Thompson Grayer, MD > Will Meredith Leeds, MD (though has not seen)    Chief Complaint: 1 year PPM follow-up  History of Present Illness: Larry Macdonald is a 82 y.o. male with PMH notable for CHB s/p PPM, HTN; seen today for Will Meredith Leeds, MD for routine electrophysiology followup.  Last saw Dr. Rayann Macdonald 06/2021  Since last being seen in our clinic the patient reports doing very well. He works out Producer, television/film/video. Is more active during warmer months. Denies SOB or cardiac complaints.   He does not think his coreg is doing much for his BP, did not notice a difference in readings when taking it vs not taking it in past. would like to stop it if possible.   he denies chest pain, palpitations, dyspnea, PND, orthopnea, nausea, vomiting, dizziness, syncope, edema, weight gain, or early satiety.     Device Information: [employer] J dual chamber PPM, imp 03/2021; dx: CHB  Past Medical History:  Diagnosis Date   Complete heart block ([place])    s/p PPM ([place])   Hypertension     Past Surgical History:  Procedure Laterality Date   CYSTOSCOPY/URETEROSCOPY/HOLMIUM LASER/STENT PLACEMENT Right 05/21/2022   Procedure: CYSTOSCOPY/URETEROSCOPY/HOLMIUM LASER/STENT PLACEMENT;  Surgeon: Larry Bring, MD;  Location: [facility];  Service: Urology;  Laterality: Right;   PACEMAKER IMPLANT N/A 04/02/2021   Procedure: PACEMAKER IMPLANT;  Surgeon: Thompson Grayer, MD;  Location: [facility];  Service: Cardiovascular;  Laterality: N/A;    Current Outpatient Medications  Medication Instructions   acetaminophen (TYLENOL) 650-1,300 mg, Oral, 2 times daily PRN   allopurinol (ZYLOPRIM) 300 mg, Oral, Daily at bedtime   Calcium Carbonate Antacid (TUMS PO) 2 tablets, Oral, As needed   carvedilol (COREG) 3.125 mg, Oral, 2 times  daily   cetirizine (ZYRTEC) 10 mg, Oral, Daily PRN   fluticasone (FLONASE) 50 MCG/ACT nasal spray 1 spray, Each Nare, Daily PRN   oxyCODONE-acetaminophen (PERCOCET/ROXICET) 5-325 MG tablet 1 tablet, Oral, Daily PRN   sildenafil (REVATIO) 100 mg, Oral, As needed   Vitamin D3 2,000 Units, Oral, Daily at bedtime    Social History:  The patient  reports that he has never smoked. He does not have any smokeless tobacco history on file. He reports current alcohol use. He reports that he does not use drugs.   Family History:  The patient's family history includes Lung cancer in his father.  ROS:  Please see the history of present illness. All other systems are reviewed and otherwise negative.   PHYSICAL EXAM:  Vitals:   12/09/22 0905 12/09/22 0944  BP: (!) 150/102 (!) 158/88  Pulse: 60   Height: 5' 11"$  (1.803 m)   Weight: 183 lb (83 kg)   SpO2: 99%   BMI (Calculated): 25.53      GEN- The patient is well appearing, alert and oriented x 3 today.   HEENT: normocephalic, atraumatic; sclera clear, conjunctiva pink; hearing intact; oropharynx clear; neck supple, no JVP Lungs- Clear to ausculation bilaterally, normal work of breathing.  No wheezes, rales, rhonchi Heart- Regular rate and rhythm, no murmurs, rubs or gallops, PMI not laterally displaced GI- soft, non-tender, non-distended, bowel sounds present, no hepatosplenomegaly Extremities- No peripheral edema. no clubbing or cyanosis; DP/PT/radial pulses 2+ bilaterally MS- no significant deformity or atrophy Skin- warm and dry, no rash or lesion, device  pocket well-healed Psych- euthymic mood, full affect Neuro- strength and sensation are intact   Device interrogation done today and reviewed by myself:  Battery good Lead thresholds, impedence, sensing stable  No episodes No changes made today  EKG is ordered. Personal review of EKG from today shows:  AV-paced, rate 63bpm, wide QRS at 158m  Recent Labs: 05/21/2022: ALT 12; Hemoglobin  15.8; Platelets 131 05/22/2022: BUN 32; Creatinine, Ser 1.93; Potassium 5.3; Sodium 139  No results found for requested labs within last 365 days.   CrCl cannot be calculated (Patient's most recent lab result is older than the maximum 21 days allowed.).   Wt Readings from Last 3 Encounters:  12/09/22 183 lb (83 kg)  05/21/22 185 lb (83.9 kg)  07/08/21 184 lb 6.4 oz (83.6 kg)     Additional studies reviewed include: Previous EP, cardiology notes.   TTE 04/02/21  1. Left ventricular ejection fraction, by estimation, is 40 to 45%. The left ventricle has mildly decreased function. The left ventricle demonstrates global hypokinesis. The left ventricular internal cavity size was mildly dilated. Left ventricular diastolic parameters are indeterminate.   2. Right ventricular systolic function is normal. The right ventricular size is normal. Tricuspid regurgitation signal is inadequate for assessing PA pressure.   3. Left atrial size was mildly dilated.   4. The mitral valve is normal in structure. Mild mitral valve regurgitation. No evidence of mitral stenosis.   5. The aortic valve is tricuspid. Aortic valve regurgitation is trivial. Mild aortic valve sclerosis is present, with no evidence of aortic valve stenosis.   6. Aortic dilatation noted. There is borderline dilatation of the aortic root, measuring 38 mm. There is mild dilatation of the ascending aorta, measuring 42 mm.   7. The inferior vena cava is normal in size with greater than 50% respiratory variability, suggesting right atrial pressure of 3 mmHg.    ASSESSMENT AND PLAN:  #) CHB s/p PPM Device functioning well, see paceart for details Histograms somewhat blunted, but patient denies symptoms during activity Will cont to monitor  #) HTN Elevated in office on recheck Says home readings are much lower, systolics staying below [id number] requests to stop coreg if possible. We discussed finishing current bottle of medication then  stopping. He will check BP daily after this to monitor response. I have provided a coreg refill in case BP elevates after stopping   Current medicines are reviewed at length with the patient today.   The patient has concerns regarding his medicines.  The following changes were made today:  none  Labs/ tests ordered today include:  Orders Placed This Encounter  Procedures   EKG 12-Lead     Disposition: Follow up with Dr. CCurt Bearsin in 12 months to establish care. [facility]   Signed, SMamie Levers NP  12/09/22  9:43 AM  Electrophysiology [facility]

## 2022-12-09 ENCOUNTER — Ambulatory Visit: Payer: Medicare Other | Attending: Student | Admitting: Cardiology

## 2022-12-09 ENCOUNTER — Encounter: Payer: Self-pay | Admitting: Student

## 2022-12-09 VITALS — BP 158/88 | HR 60 | Ht 71.0 in | Wt 183.0 lb

## 2022-12-09 DIAGNOSIS — Z950 Presence of cardiac pacemaker: Secondary | ICD-10-CM

## 2022-12-09 DIAGNOSIS — I442 Atrioventricular block, complete: Secondary | ICD-10-CM

## 2022-12-09 LAB — CUP PACEART INCLINIC DEVICE CHECK
Battery Remaining Longevity: 80 mo
Battery Voltage: 3.01 V
Brady Statistic RA Percent Paced: 86 %
Brady Statistic RV Percent Paced: 99.93 %
Date Time Interrogation Session: 20240220103036
Implantable Lead Connection Status: 753985
Implantable Lead Connection Status: 753985
Implantable Lead Implant Date: 20220614
Implantable Lead Implant Date: 20220614
Implantable Lead Location: 753859
Implantable Lead Location: 753860
Implantable Lead Model: 3830
Implantable Pulse Generator Implant Date: 20220614
Lead Channel Impedance Value: 412.5 Ohm
Lead Channel Impedance Value: 575 Ohm
Lead Channel Pacing Threshold Amplitude: 0.75 V
Lead Channel Pacing Threshold Amplitude: 0.75 V
Lead Channel Pacing Threshold Amplitude: 1 V
Lead Channel Pacing Threshold Amplitude: 1 V
Lead Channel Pacing Threshold Pulse Width: 0.5 ms
Lead Channel Pacing Threshold Pulse Width: 0.5 ms
Lead Channel Pacing Threshold Pulse Width: 0.5 ms
Lead Channel Pacing Threshold Pulse Width: 0.5 ms
Lead Channel Sensing Intrinsic Amplitude: 5 mV
Lead Channel Setting Pacing Amplitude: 2 V
Lead Channel Setting Pacing Amplitude: 2.5 V
Lead Channel Setting Pacing Pulse Width: 0.5 ms
Lead Channel Setting Sensing Sensitivity: 4 mV
Pulse Gen Model: 2272
Pulse Gen Serial Number: 3933533

## 2022-12-09 MED ORDER — CARVEDILOL 3.125 MG PO TABS: 3.1250 mg | ORAL_TABLET | Freq: Two times a day (BID) | ORAL | 3 refills | Status: AC

## 2022-12-09 NOTE — Patient Instructions (Signed)
Medication Instructions:  Your physician recommends that you continue on your current medications as directed. Please refer to the Current Medication list given to you today.  *If you need a refill on your cardiac medications before your next appointment, please call your pharmacy*   Lab Work: None ordered  If you have labs (blood work) drawn today and your tests are completely normal, you will receive your results only by: [place] (if you have MyChart) OR A paper copy in the mail If you have any lab test that is abnormal or we need to change your treatment, we will call you to review the results.   Testing/Procedures: None ordered   Follow-Up: At [facility], you and your health needs are our priority.  As part of our continuing mission to provide you with exceptional heart care, we have created designated Provider Care Teams.  These Care Teams include your primary Cardiologist (physician) and Advanced Practice Providers (APPs -  Physician Assistants and Nurse Practitioners) who all work together to provide you with the care you need, when you need it.  We recommend signing up for the patient portal called "MyChart".  Sign up information is provided on this After Visit Summary.  MyChart is used to connect with patients for Virtual Visits (Telemedicine).  Patients are able to view lab/test results, encounter notes, upcoming appointments, etc.  Non-urgent messages can be sent to your provider as well.   To learn more about what you can do with MyChart, go to [email or web address].    Your next appointment:   1 year(s)  Provider:   DR. Curt Bears IN [facility]      Other Instructions

## 2023-01-01 ENCOUNTER — Ambulatory Visit (INDEPENDENT_AMBULATORY_CARE_PROVIDER_SITE_OTHER): Payer: Medicare Other

## 2023-01-01 DIAGNOSIS — I442 Atrioventricular block, complete: Secondary | ICD-10-CM

## 2023-01-02 LAB — CUP PACEART REMOTE DEVICE CHECK
Battery Remaining Longevity: 83 mo
Battery Remaining Percentage: 82 %
Battery Voltage: 3.01 V
Brady Statistic AP VP Percent: 87 %
Brady Statistic AP VS Percent: 1 %
Brady Statistic AS VP Percent: 13 %
Brady Statistic AS VS Percent: 1 %
Brady Statistic RA Percent Paced: 87 %
Brady Statistic RV Percent Paced: 99 %
Date Time Interrogation Session: 20240314020021
Implantable Lead Connection Status: 753985
Implantable Lead Connection Status: 753985
Implantable Lead Implant Date: 20220614
Implantable Lead Implant Date: 20220614
Implantable Lead Location: 753859
Implantable Lead Location: 753860
Implantable Lead Model: 3830
Implantable Pulse Generator Implant Date: 20220614
Lead Channel Impedance Value: 400 Ohm
Lead Channel Impedance Value: 580 Ohm
Lead Channel Pacing Threshold Amplitude: 0.75 V
Lead Channel Pacing Threshold Amplitude: 1 V
Lead Channel Pacing Threshold Pulse Width: 0.5 ms
Lead Channel Pacing Threshold Pulse Width: 0.5 ms
Lead Channel Sensing Intrinsic Amplitude: 5 mV
Lead Channel Sensing Intrinsic Amplitude: 9.6 mV
Lead Channel Setting Pacing Amplitude: 2 V
Lead Channel Setting Pacing Amplitude: 2.5 V
Lead Channel Setting Pacing Pulse Width: 0.5 ms
Lead Channel Setting Sensing Sensitivity: 4 mV
Pulse Gen Model: 2272
Pulse Gen Serial Number: 3933533

## 2023-02-03 NOTE — Progress Notes (Signed)
Remote pacemaker transmission.   

## 2023-04-02 ENCOUNTER — Ambulatory Visit (INDEPENDENT_AMBULATORY_CARE_PROVIDER_SITE_OTHER): Payer: Medicare Other

## 2023-04-02 DIAGNOSIS — I442 Atrioventricular block, complete: Secondary | ICD-10-CM

## 2023-04-02 LAB — CUP PACEART REMOTE DEVICE CHECK
Battery Remaining Longevity: 79 mo
Battery Remaining Percentage: 79 %
Battery Voltage: 2.99 V
Brady Statistic AP VP Percent: 86 %
Brady Statistic AP VS Percent: 1 %
Brady Statistic AS VP Percent: 14 %
Brady Statistic AS VS Percent: 1 %
Brady Statistic RA Percent Paced: 86 %
Brady Statistic RV Percent Paced: 99 %
Date Time Interrogation Session: 20240613025135
Implantable Lead Connection Status: 753985
Implantable Lead Connection Status: 753985
Implantable Lead Implant Date: 20220614
Implantable Lead Implant Date: 20220614
Implantable Lead Location: 753859
Implantable Lead Location: 753860
Implantable Lead Model: 3830
Implantable Pulse Generator Implant Date: 20220614
Lead Channel Impedance Value: 400 Ohm
Lead Channel Impedance Value: 550 Ohm
Lead Channel Pacing Threshold Amplitude: 0.75 V
Lead Channel Pacing Threshold Amplitude: 1 V
Lead Channel Pacing Threshold Pulse Width: 0.5 ms
Lead Channel Pacing Threshold Pulse Width: 0.5 ms
Lead Channel Sensing Intrinsic Amplitude: 5 mV
Lead Channel Sensing Intrinsic Amplitude: 5.6 mV
Lead Channel Setting Pacing Amplitude: 2 V
Lead Channel Setting Pacing Amplitude: 2.5 V
Lead Channel Setting Pacing Pulse Width: 0.5 ms
Lead Channel Setting Sensing Sensitivity: 4 mV
Pulse Gen Model: 2272
Pulse Gen Serial Number: 3933533

## 2023-04-21 NOTE — Progress Notes (Signed)
Remote pacemaker transmission.   

## 2023-07-02 ENCOUNTER — Ambulatory Visit (INDEPENDENT_AMBULATORY_CARE_PROVIDER_SITE_OTHER): Payer: Medicare Other

## 2023-07-02 DIAGNOSIS — I442 Atrioventricular block, complete: Secondary | ICD-10-CM | POA: Diagnosis not present

## 2023-07-02 LAB — CUP PACEART REMOTE DEVICE CHECK
Battery Remaining Longevity: 76 mo
Battery Remaining Percentage: 76 %
Battery Voltage: 2.99 V
Brady Statistic AP VP Percent: 86 %
Brady Statistic AP VS Percent: 1 %
Brady Statistic AS VP Percent: 14 %
Brady Statistic AS VS Percent: 1 %
Brady Statistic RA Percent Paced: 86 %
Brady Statistic RV Percent Paced: 99 %
Date Time Interrogation Session: 20240912020714
Implantable Lead Connection Status: 753985
Implantable Lead Connection Status: 753985
Implantable Lead Implant Date: 20220614
Implantable Lead Implant Date: 20220614
Implantable Lead Location: 753859
Implantable Lead Location: 753860
Implantable Lead Model: 3830
Implantable Pulse Generator Implant Date: 20220614
Lead Channel Impedance Value: 400 Ohm
Lead Channel Impedance Value: 550 Ohm
Lead Channel Pacing Threshold Amplitude: 0.75 V
Lead Channel Pacing Threshold Amplitude: 1 V
Lead Channel Pacing Threshold Pulse Width: 0.5 ms
Lead Channel Pacing Threshold Pulse Width: 0.5 ms
Lead Channel Sensing Intrinsic Amplitude: 5 mV
Lead Channel Sensing Intrinsic Amplitude: 5.2 mV
Lead Channel Setting Pacing Amplitude: 2 V
Lead Channel Setting Pacing Amplitude: 2.5 V
Lead Channel Setting Pacing Pulse Width: 0.5 ms
Lead Channel Setting Sensing Sensitivity: 4 mV
Pulse Gen Model: 2272
Pulse Gen Serial Number: 3933533

## 2023-07-14 NOTE — Progress Notes (Signed)
Remote pacemaker transmission.   

## 2023-10-01 ENCOUNTER — Ambulatory Visit (INDEPENDENT_AMBULATORY_CARE_PROVIDER_SITE_OTHER): Payer: Medicare Other

## 2023-10-01 DIAGNOSIS — I442 Atrioventricular block, complete: Secondary | ICD-10-CM | POA: Diagnosis not present

## 2023-10-02 LAB — CUP PACEART REMOTE DEVICE CHECK
Battery Remaining Longevity: 73 mo
Battery Remaining Percentage: 74 %
Battery Voltage: 2.99 V
Brady Statistic AP VP Percent: 86 %
Brady Statistic AP VS Percent: 1 %
Brady Statistic AS VP Percent: 14 %
Brady Statistic AS VS Percent: 1 %
Brady Statistic RA Percent Paced: 86 %
Brady Statistic RV Percent Paced: 99 %
Date Time Interrogation Session: 20241212075019
Implantable Lead Connection Status: 753985
Implantable Lead Connection Status: 753985
Implantable Lead Implant Date: 20220614
Implantable Lead Implant Date: 20220614
Implantable Lead Location: 753859
Implantable Lead Location: 753860
Implantable Lead Model: 3830
Implantable Pulse Generator Implant Date: 20220614
Lead Channel Impedance Value: 400 Ohm
Lead Channel Impedance Value: 530 Ohm
Lead Channel Pacing Threshold Amplitude: 0.75 V
Lead Channel Pacing Threshold Amplitude: 1 V
Lead Channel Pacing Threshold Pulse Width: 0.5 ms
Lead Channel Pacing Threshold Pulse Width: 0.5 ms
Lead Channel Sensing Intrinsic Amplitude: 5 mV
Lead Channel Sensing Intrinsic Amplitude: 5.2 mV
Lead Channel Setting Pacing Amplitude: 2 V
Lead Channel Setting Pacing Amplitude: 2.5 V
Lead Channel Setting Pacing Pulse Width: 0.5 ms
Lead Channel Setting Sensing Sensitivity: 4 mV
Pulse Gen Model: 2272
Pulse Gen Serial Number: 3933533

## 2023-11-06 NOTE — Addendum Note (Signed)
Addended by: Elease Etienne A on: 11/06/2023 10:20 AM   Modules accepted: Orders

## 2023-11-06 NOTE — Progress Notes (Signed)
Remote pacemaker transmission.   

## 2023-12-31 ENCOUNTER — Ambulatory Visit: Payer: Medicare Other

## 2023-12-31 DIAGNOSIS — I442 Atrioventricular block, complete: Secondary | ICD-10-CM

## 2023-12-31 LAB — CUP PACEART REMOTE DEVICE CHECK
Battery Remaining Longevity: 70 mo
Battery Remaining Percentage: 71 %
Battery Voltage: 2.99 V
Brady Statistic AP VP Percent: 86 %
Brady Statistic AP VS Percent: 1 %
Brady Statistic AS VP Percent: 14 %
Brady Statistic AS VS Percent: 1 %
Brady Statistic RA Percent Paced: 86 %
Brady Statistic RV Percent Paced: 99 %
Date Time Interrogation Session: 20250313052829
Implantable Lead Connection Status: 753985
Implantable Lead Connection Status: 753985
Implantable Lead Implant Date: 20220614
Implantable Lead Implant Date: 20220614
Implantable Lead Location: 753859
Implantable Lead Location: 753860
Implantable Lead Model: 3830
Implantable Pulse Generator Implant Date: 20220614
Lead Channel Impedance Value: 390 Ohm
Lead Channel Impedance Value: 540 Ohm
Lead Channel Pacing Threshold Amplitude: 0.75 V
Lead Channel Pacing Threshold Amplitude: 1 V
Lead Channel Pacing Threshold Pulse Width: 0.5 ms
Lead Channel Pacing Threshold Pulse Width: 0.5 ms
Lead Channel Sensing Intrinsic Amplitude: 5 mV
Lead Channel Sensing Intrinsic Amplitude: 5.2 mV
Lead Channel Setting Pacing Amplitude: 2 V
Lead Channel Setting Pacing Amplitude: 2.5 V
Lead Channel Setting Pacing Pulse Width: 0.5 ms
Lead Channel Setting Sensing Sensitivity: 4 mV
Pulse Gen Model: 2272
Pulse Gen Serial Number: 3933533

## 2024-02-11 NOTE — Progress Notes (Signed)
 Remote pacemaker transmission.

## 2024-03-31 ENCOUNTER — Ambulatory Visit (INDEPENDENT_AMBULATORY_CARE_PROVIDER_SITE_OTHER): Payer: Medicare Other

## 2024-03-31 DIAGNOSIS — I442 Atrioventricular block, complete: Secondary | ICD-10-CM

## 2024-03-31 LAB — CUP PACEART REMOTE DEVICE CHECK
Battery Remaining Longevity: 68 mo
Battery Remaining Percentage: 68 %
Battery Voltage: 2.99 V
Brady Statistic AP VP Percent: 86 %
Brady Statistic AP VS Percent: 1 %
Brady Statistic AS VP Percent: 14 %
Brady Statistic AS VS Percent: 1 %
Brady Statistic RA Percent Paced: 86 %
Brady Statistic RV Percent Paced: 99 %
Date Time Interrogation Session: 20250612020012
Implantable Lead Connection Status: 753985
Implantable Lead Connection Status: 753985
Implantable Lead Implant Date: 20220614
Implantable Lead Implant Date: 20220614
Implantable Lead Location: 753859
Implantable Lead Location: 753860
Implantable Lead Model: 3830
Implantable Pulse Generator Implant Date: 20220614
Lead Channel Impedance Value: 400 Ohm
Lead Channel Impedance Value: 540 Ohm
Lead Channel Pacing Threshold Amplitude: 0.75 V
Lead Channel Pacing Threshold Amplitude: 1 V
Lead Channel Pacing Threshold Pulse Width: 0.5 ms
Lead Channel Pacing Threshold Pulse Width: 0.5 ms
Lead Channel Sensing Intrinsic Amplitude: 5 mV
Lead Channel Sensing Intrinsic Amplitude: 5.2 mV
Lead Channel Setting Pacing Amplitude: 2 V
Lead Channel Setting Pacing Amplitude: 2.5 V
Lead Channel Setting Pacing Pulse Width: 0.5 ms
Lead Channel Setting Sensing Sensitivity: 4 mV
Pulse Gen Model: 2272
Pulse Gen Serial Number: 3933533

## 2024-04-05 ENCOUNTER — Ambulatory Visit: Payer: Self-pay | Admitting: Cardiology

## 2024-05-25 NOTE — Progress Notes (Signed)
 Remote pacemaker transmission.

## 2024-06-30 ENCOUNTER — Ambulatory Visit: Payer: Self-pay | Admitting: Cardiology

## 2024-06-30 ENCOUNTER — Ambulatory Visit (INDEPENDENT_AMBULATORY_CARE_PROVIDER_SITE_OTHER): Payer: Medicare Other

## 2024-06-30 DIAGNOSIS — I442 Atrioventricular block, complete: Secondary | ICD-10-CM | POA: Diagnosis not present

## 2024-06-30 LAB — CUP PACEART REMOTE DEVICE CHECK
Battery Remaining Longevity: 66 mo
Battery Remaining Percentage: 65 %
Battery Voltage: 2.99 V
Brady Statistic AP VP Percent: 87 %
Brady Statistic AP VS Percent: 1 %
Brady Statistic AS VP Percent: 13 %
Brady Statistic AS VS Percent: 1 %
Brady Statistic RA Percent Paced: 87 %
Brady Statistic RV Percent Paced: 99 %
Date Time Interrogation Session: 20250911025522
Implantable Lead Connection Status: 753985
Implantable Lead Connection Status: 753985
Implantable Lead Implant Date: 20220614
Implantable Lead Implant Date: 20220614
Implantable Lead Location: 753859
Implantable Lead Location: 753860
Implantable Lead Model: 3830
Implantable Pulse Generator Implant Date: 20220614
Lead Channel Impedance Value: 430 Ohm
Lead Channel Impedance Value: 550 Ohm
Lead Channel Pacing Threshold Amplitude: 0.75 V
Lead Channel Pacing Threshold Amplitude: 1 V
Lead Channel Pacing Threshold Pulse Width: 0.5 ms
Lead Channel Pacing Threshold Pulse Width: 0.5 ms
Lead Channel Sensing Intrinsic Amplitude: 12 mV
Lead Channel Sensing Intrinsic Amplitude: 5 mV
Lead Channel Setting Pacing Amplitude: 2 V
Lead Channel Setting Pacing Amplitude: 2.5 V
Lead Channel Setting Pacing Pulse Width: 0.5 ms
Lead Channel Setting Sensing Sensitivity: 4 mV
Pulse Gen Model: 2272
Pulse Gen Serial Number: 3933533

## 2024-07-07 NOTE — Progress Notes (Signed)
 Remote PPM Transmission

## 2024-09-29 ENCOUNTER — Ambulatory Visit: Payer: Medicare Other

## 2024-09-29 DIAGNOSIS — I442 Atrioventricular block, complete: Secondary | ICD-10-CM

## 2024-10-01 LAB — CUP PACEART REMOTE DEVICE CHECK
Battery Remaining Longevity: 62 mo
Battery Remaining Percentage: 62 %
Battery Voltage: 2.99 V
Brady Statistic AP VP Percent: 87 %
Brady Statistic AP VS Percent: 1 %
Brady Statistic AS VP Percent: 13 %
Brady Statistic AS VS Percent: 1 %
Brady Statistic RA Percent Paced: 87 %
Brady Statistic RV Percent Paced: 99 %
Date Time Interrogation Session: 20251211203631
Implantable Lead Connection Status: 753985
Implantable Lead Connection Status: 753985
Implantable Lead Implant Date: 20220614
Implantable Lead Implant Date: 20220614
Implantable Lead Location: 753859
Implantable Lead Location: 753860
Implantable Lead Model: 3830
Implantable Pulse Generator Implant Date: 20220614
Lead Channel Impedance Value: 400 Ohm
Lead Channel Impedance Value: 530 Ohm
Lead Channel Pacing Threshold Amplitude: 0.75 V
Lead Channel Pacing Threshold Amplitude: 1 V
Lead Channel Pacing Threshold Pulse Width: 0.5 ms
Lead Channel Pacing Threshold Pulse Width: 0.5 ms
Lead Channel Sensing Intrinsic Amplitude: 5 mV
Lead Channel Sensing Intrinsic Amplitude: 5.6 mV
Lead Channel Setting Pacing Amplitude: 2 V
Lead Channel Setting Pacing Amplitude: 2.5 V
Lead Channel Setting Pacing Pulse Width: 0.5 ms
Lead Channel Setting Sensing Sensitivity: 4 mV
Pulse Gen Model: 2272
Pulse Gen Serial Number: 3933533

## 2024-10-06 NOTE — Progress Notes (Signed)
 Remote PPM Transmission

## 2024-10-17 ENCOUNTER — Ambulatory Visit: Payer: Self-pay | Admitting: Cardiology
